# Patient Record
Sex: Female | Born: 1990 | Hispanic: Yes | Marital: Single | Smoking: Never smoker
Health system: Southern US, Community
[De-identification: ages and names within clinical notes are randomized; demographics above are authoritative.]

## PROBLEM LIST (undated history)

## (undated) DIAGNOSIS — Z789 Other specified health status: Secondary | ICD-10-CM

## (undated) HISTORY — PX: OTHER SURGICAL HISTORY: SHX169

## (undated) HISTORY — PX: TONSILLECTOMY: SUR1361

---
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
## 2019-03-20 ENCOUNTER — Inpatient Hospital Stay (HOSPITAL_COMMUNITY)
Admission: AD | Admit: 2019-03-20 | Discharge: 2019-03-21 | Disposition: A | Discharge status: Home / Self Care | Payer: Medicaid Other | Attending: Family Medicine | Admitting: Family Medicine

## 2019-03-20 ENCOUNTER — Other Ambulatory Visit: Payer: Self-pay

## 2019-03-20 ENCOUNTER — Encounter (HOSPITAL_COMMUNITY): Payer: Self-pay | Admitting: *Deleted

## 2019-03-20 DIAGNOSIS — R109 Unspecified abdominal pain: Secondary | ICD-10-CM | POA: Diagnosis present

## 2019-03-20 DIAGNOSIS — O039 Complete or unspecified spontaneous abortion without complication: Secondary | ICD-10-CM | POA: Diagnosis not present

## 2019-03-20 DIAGNOSIS — Z87891 Personal history of nicotine dependence: Secondary | ICD-10-CM | POA: Insufficient documentation

## 2019-03-20 DIAGNOSIS — O209 Hemorrhage in early pregnancy, unspecified: Secondary | ICD-10-CM | POA: Insufficient documentation

## 2019-03-20 DIAGNOSIS — Z3A13 13 weeks gestation of pregnancy: Secondary | ICD-10-CM | POA: Diagnosis not present

## 2019-03-20 DIAGNOSIS — Z3A12 12 weeks gestation of pregnancy: Secondary | ICD-10-CM | POA: Diagnosis not present

## 2019-03-20 DIAGNOSIS — O034 Incomplete spontaneous abortion without complication: Secondary | ICD-10-CM

## 2019-03-20 HISTORY — DX: Other specified health status: Z78.9

## 2019-03-20 LAB — URINALYSIS, ROUTINE W REFLEX MICROSCOPIC
Bilirubin Urine: NEGATIVE
Glucose, UA: NEGATIVE mg/dL
Ketones, ur: NEGATIVE mg/dL
Nitrite: NEGATIVE
Protein, ur: NEGATIVE mg/dL
Specific Gravity, Urine: 1.025 (ref 1.005–1.030)
pH: 6 (ref 5.0–8.0)

## 2019-03-20 LAB — POCT PREGNANCY, URINE: Preg Test, Ur: POSITIVE — AB

## 2019-03-20 NOTE — ED Notes (Signed)
Report given to cheryl and MAU and transport called for transportation [facility].

## 2019-03-20 NOTE — MAU Note (Signed)
SHE TOOK 3 HPT - MAY 10-POSITIVE. Marland Kitchen HAS AN APPOINTMENT -  WITH [facility] IN [place]. SPOTTING STARTED - BROWN - ON UNDERWEAR . FEELS CRAMPS - STARTED TODAY - TOOK REG TYLENOL1 TAB AT 11AM- NO RELIEF.  LAST SEX-  1-2 WEEKS AGO.

## 2019-03-20 NOTE — MAU Provider Note (Addendum)
History     CSN: [id number]  Arrival date and time: 03/20/19 [id number]   First Provider Initiated Contact with Patient 03/20/19 2344      Chief Complaint  Patient presents with  . Abdominal Pain   Amanda Pineda is a 28 y.o. G1P0 at 4835w6d by Definite LMP who is [facility].  She presents today for Abdominal Pain that started this morning. She describes the pain as sharp intermittent and was not relieved with 500mg  tylenol.  She currently rates the pain a 10/10 and states she is "just dealing with it."  Patient also reports back pain that started about 2 hours ago in her lower back.  Patient reports that she has been having dark brown spotting since yesterday. She reports that she has to wear a pad and reports that she is not requiring pad changes, but the pad is halfway currently.   Patient has not had an ultrasound with this pregnancy.  Patient denies sexual activity in the last 72 hours.      OB History    Gravida  1   Para      Term      Preterm      AB      Living        SAB      TAB      Ectopic      Multiple      Live Births              Past Medical History:  Diagnosis Date  . Medical history non-contributory     Past Surgical History:  Procedure Laterality Date  . OTHER SURGICAL HISTORY     cyst over eye?  . TONSILLECTOMY      History reviewed. No pertinent family history.  Social History   Tobacco Use  . Smoking status: Former Smoker  Substance Use Topics  . Alcohol use: Not Currently    Frequency: Never  . Drug use: Never    Allergies: No Known Allergies  Medications Prior to Admission  Medication Sig Dispense Refill Last Dose  . prenatal vitamin w/FE, FA (PRENATAL 1 + 1) 27-1 MG TABS tablet Take 1 tablet by mouth daily at 12 noon.   03/20/2019 at Unknown time    Review of Systems  Constitutional: Negative for chills and fever.  Respiratory: Negative for cough and shortness of breath.    Gastrointestinal: Positive for abdominal pain. Negative for constipation, diarrhea, nausea and vomiting.  Genitourinary: Positive for vaginal bleeding (Brownish). Negative for difficulty urinating, dysuria and vaginal discharge.  Musculoskeletal: Positive for back pain (Lower).  Neurological: Negative for dizziness, light-headedness and headaches.   Physical Exam   Blood pressure 111/65, pulse 79, temperature 99.5 F (37.5 C), temperature source Oral, resp. rate 20, height 5' (1.524 m), weight 59.6 kg, last menstrual period 12/20/2018, SpO2 100 %.  Physical Exam  Constitutional: She is oriented to person, place, and time. She appears well-developed and well-nourished.  HENT:  Head: Normocephalic and atraumatic.  Eyes: Conjunctivae are normal.  Neck: Normal range of motion.  Cardiovascular: Normal rate, regular rhythm and normal heart sounds.  Respiratory: Effort normal and breath sounds normal.  GI: Soft. There is no abdominal tenderness.  Genitourinary: Uterus is enlarged. Cervix exhibits motion tenderness.    Vaginal bleeding present.  There is bleeding in the vagina.    Genitourinary Comments:  Speculum Exam: -Vaginal Vault: Pink Mucosa.  Large amt dark red blood in  vault with questionable tissue.  Removed with ring forceps and gauze x 4 -wet prep collected -Cervix:Pink, no lesions or cysts.  Polyps noted posterior to os.  Os appears closed. No active bleeding from os-GC/CT collected -Bimanual Exam: Closed Uterus c/w 8-[redacted] week gestation-displaced to the right   Musculoskeletal: Normal range of motion.  Neurological: She is alert and oriented to person, place, and time.  Skin: Skin is warm and dry.  Psychiatric: She has a normal mood and affect. Her behavior is normal.    MAU Course  Procedures Results for orders placed or performed during the hospital encounter of 03/20/19 (from the past 24 hour(s))  Urinalysis, Routine w reflex microscopic     Status: Abnormal   Collection  Time: 03/20/19  8:32 PM  Result Value Ref Range   Color, Urine YELLOW YELLOW   APPearance HAZY (A) CLEAR   Specific Gravity, Urine 1.025 1.005 - 1.030   pH 6.0 5.0 - 8.0   Glucose, UA NEGATIVE NEGATIVE mg/dL   Hgb urine dipstick MODERATE (A) NEGATIVE   Bilirubin Urine NEGATIVE NEGATIVE   Ketones, ur NEGATIVE NEGATIVE mg/dL   Protein, ur NEGATIVE NEGATIVE mg/dL   Nitrite NEGATIVE NEGATIVE   Leukocytes,Ua TRACE (A) NEGATIVE   RBC / HPF 6-10 0 - 5 RBC/hpf   WBC, UA 6-10 0 - 5 WBC/hpf   Bacteria, UA RARE (A) NONE SEEN   Squamous Epithelial / LPF 6-10 0 - 5   Mucus PRESENT   Pregnancy, urine POC     Status: Abnormal   Collection Time: 03/20/19  9:01 PM  Result Value Ref Range   Preg Test, Ur POSITIVE (A) NEGATIVE  Wet prep, genital     Status: Abnormal   Collection Time: 03/21/19 12:01 AM   Specimen: Thin Prep Cervical/Endocervical  Result Value Ref Range   Yeast Wet Prep HPF POC NONE SEEN NONE SEEN   Trich, Wet Prep NONE SEEN NONE SEEN   Clue Cells Wet Prep HPF POC NONE SEEN NONE SEEN   WBC, Wet Prep HPF POC MANY (A) NONE SEEN   Sperm NONE SEEN   CBC     Status: Abnormal   Collection Time: 03/21/19 12:52 AM  Result Value Ref Range   WBC 13.2 (H) 4.0 - 10.5 K/uL   RBC 4.22 3.87 - 5.11 MIL/uL   Hemoglobin 12.3 12.0 - 15.0 g/dL   HCT 36.8 36.0 - 46.0 %   MCV 87.2 80.0 - 100.0 fL   MCH 29.1 26.0 - 34.0 pg   MCHC 33.4 30.0 - 36.0 g/dL   RDW 12.9 11.5 - 15.5 %   Platelets 283 150 - 400 K/uL   nRBC 0.0 0.0 - 0.2 %  Type and screen     Status: None   Collection Time: 03/21/19 12:52 AM  Result Value Ref Range   ABO/RH(D) O POS    Antibody Screen NEG    Sample Expiration      03/24/2019,2359 Performed at [facility], [address] [place], [place] [address]   hCG, quantitative, pregnancy     Status: Abnormal   Collection Time: 03/21/19 12:52 AM  Result Value Ref Range   hCG, Beta Chain, Quant, S 2,742 (H) <5 mIU/mL  ABO/Rh     Status: None   Collection Time:  03/21/19 12:52 AM  Result Value Ref Range   ABO/RH(D)      O POS Performed at [facility] [address], [place],  [address]  [place] Ob Less Than 14 Weeks With Ob Transvaginal  Result Date: 03/21/2019 CLINICAL DATA:  Pregnant, abdominal pain EXAM: OBSTETRIC <14 WK US AND TRANSVAGINAL OB US TECHNIQUE: Both transabdominal and transvaginal ultrasound examinations were performed for complete evaluation of the gestation as well as the maternal uterus, adnexal regions, and pelvic cul-de-sac. Transvaginal technique was performed to assess early pregnancy. COMPARISON:  None. FINDINGS: Intrauterine gestational sac: Irregular gestational sac is suspected Yolk sac:  Not visualized Embryo:  Not visualized MSD: 48.9 mm   10 w   3 d Subchorionic hemorrhage:  None visualized. Maternal uterus/adnexae: Bilateral ovaries are within normal limits. No free fluid. IMPRESSION: Suspected irregular gestational sac, measuring 10 weeks 3 days by mean sac diameter. No yolk sac or fetal pole is visualized. This appearance is not compatible with normal (viable) IUP. Findings meet definitive criteria for failed pregnancy. This follows SRU consensus guidelines: Diagnostic Criteria for Nonviable Pregnancy Early in the First Trimester. Macy Mis Engl J Med [phone];369:1443-51. Electronically Signed   By: Charline BillsSriyesh  Krishnan M.D.   On: 03/21/2019 01:49    MDM Pelvic Exam with cultures Labs: UA, CBC, hCG, T&S, Wet prep, and GC/CT TVUS Pain Medication Assessment and Plan  28 year old G1P0 12.6 weeks by Definite LMP Vaginal Bleeding  -Exam findings discussed. -Informed that excessive bleeding and inability of nurse to obtain FHR is of concern.  -Cultures collected -Percocet 5mg  ordered -Will send to US for evaluation -Labs ordered  Follow Up (2:18 AM) Failed IUP at 10.3 weeks  -Wet prep returns with insignificant findings. -Results discussed with patient. -Informed that GC/CT will return within 2-3 days. -Reviewed  all lab results.  -Discussed US findings and inevitable miscarriage. -Patient and SO appropriately upset. -Condolences given. -Allowed time to process information and provide comfort to one another.   Follow Up (2:33 AM)  -Patient and SO questions and concerns addressed including;  *Causes of miscarriage *Next steps in process  *How soon can they conceive again. -Discussed miscarriage options including expectant, pharmacological, and surgical management. -Patient and SO unable to make decision at current. -In[facility]ed [facility], but inform them of loss. -Further instructed to discuss conception time frame with provider as each provider gives different recommendation. -No other questions -Bleeding precautions given including what to expect in regards to miscarriage. -Rx for Percocet 5mg  Disp 6, RF 0 sent to pharmacy on file. -Encouraged to call or return to MAU if symptoms worsen or with the onset of new symptoms. -Discharged to home in stable condition.  Cherre RobinsJessica L Venita Seng MSN, CNM 03/20/2019, 11:44 PM

## 2019-03-21 ENCOUNTER — Inpatient Hospital Stay (HOSPITAL_COMMUNITY): Payer: Medicaid Other

## 2019-03-21 ENCOUNTER — Encounter (HOSPITAL_COMMUNITY): Payer: Self-pay

## 2019-03-21 ENCOUNTER — Inpatient Hospital Stay (EMERGENCY_DEPARTMENT_HOSPITAL)
Admission: AD | Admit: 2019-03-21 | Discharge: 2019-03-21 | Disposition: A | Discharge status: Home / Self Care | Payer: Medicaid Other | Source: Home / Self Care | Attending: Obstetrics and Gynecology | Admitting: Obstetrics and Gynecology

## 2019-03-21 DIAGNOSIS — O039 Complete or unspecified spontaneous abortion without complication: Secondary | ICD-10-CM | POA: Insufficient documentation

## 2019-03-21 DIAGNOSIS — Z3A13 13 weeks gestation of pregnancy: Secondary | ICD-10-CM

## 2019-03-21 DIAGNOSIS — O219 Vomiting of pregnancy, unspecified: Secondary | ICD-10-CM

## 2019-03-21 DIAGNOSIS — Z87891 Personal history of nicotine dependence: Secondary | ICD-10-CM | POA: Insufficient documentation

## 2019-03-21 DIAGNOSIS — N939 Abnormal uterine and vaginal bleeding, unspecified: Secondary | ICD-10-CM

## 2019-03-21 DIAGNOSIS — R102 Pelvic and perineal pain: Secondary | ICD-10-CM

## 2019-03-21 DIAGNOSIS — Z6790 Unspecified blood type, Rh positive: Secondary | ICD-10-CM

## 2019-03-21 DIAGNOSIS — O26899 Other specified pregnancy related conditions, unspecified trimester: Secondary | ICD-10-CM

## 2019-03-21 LAB — CBC
HCT: 36.8 % (ref 36.0–46.0)
HCT: 36.2 % (ref 36.0–46.0)
Hemoglobin: 12.3 g/dL (ref 12.0–15.0)
Hemoglobin: 12.3 g/dL (ref 12.0–15.0)
MCH: 29.1 pg (ref 26.0–34.0)
MCH: 29.4 pg (ref 26.0–34.0)
MCHC: 33.4 g/dL (ref 30.0–36.0)
MCHC: 34 g/dL (ref 30.0–36.0)
MCV: 87.2 fL (ref 80.0–100.0)
MCV: 86.4 fL (ref 80.0–100.0)
Platelets: 283 10*3/uL (ref 150–400)
Platelets: 267 10*3/uL (ref 150–400)
RBC: 4.22 MIL/uL (ref 3.87–5.11)
RBC: 4.19 MIL/uL (ref 3.87–5.11)
RDW: 12.9 % (ref 11.5–15.5)
RDW: 12.7 % (ref 11.5–15.5)
WBC: 13.2 10*3/uL — ABNORMAL HIGH (ref 4.0–10.5)
WBC: 18.4 10*3/uL — ABNORMAL HIGH (ref 4.0–10.5)
nRBC: 0 % (ref 0.0–0.2)
nRBC: 0 % (ref 0.0–0.2)

## 2019-03-21 LAB — WET PREP, GENITAL
Clue Cells Wet Prep HPF POC: NONE SEEN
Sperm: NONE SEEN
Trich, Wet Prep: NONE SEEN
Yeast Wet Prep HPF POC: NONE SEEN

## 2019-03-21 LAB — HCG, QUANTITATIVE, PREGNANCY: hCG, Beta Chain, Quant, S: 2742 m[IU]/mL — ABNORMAL HIGH (ref <5)

## 2019-03-21 LAB — TYPE AND SCREEN
ABO/RH(D): O POS
Antibody Screen: NEGATIVE

## 2019-03-21 LAB — ABO/RH: ABO/RH(D): O POS

## 2019-03-21 MED ORDER — KETOROLAC TROMETHAMINE 30 MG/ML IJ SOLN
30.0000 mg | Freq: Once | INTRAMUSCULAR | Status: AC
  Administered 2019-03-21: 30 mg via INTRAMUSCULAR
  Filled 2019-03-21: qty 1

## 2019-03-21 MED ORDER — PROMETHAZINE HCL 25 MG/ML IJ SOLN
12.5000 mg | Freq: Once | INTRAMUSCULAR | Status: AC
  Administered 2019-03-21: 12.5 mg via INTRAMUSCULAR
  Filled 2019-03-21: qty 1

## 2019-03-21 MED ORDER — OXYCODONE-ACETAMINOPHEN 5-325 MG PO TABS
1.0000 | ORAL_TABLET | Freq: Once | ORAL | Status: AC
  Administered 2019-03-21: 1 via ORAL
  Filled 2019-03-21: qty 1

## 2019-03-21 MED ORDER — OXYCODONE-ACETAMINOPHEN 5-325 MG PO TABS: 1.0000 | ORAL_TABLET | ORAL | 0 refills | Status: DC | PRN

## 2019-03-21 MED ORDER — PROMETHAZINE HCL 12.5 MG PO TABS: 12.5000 mg | ORAL_TABLET | Freq: Four times a day (QID) | ORAL | 0 refills | Status: DC | PRN

## 2019-03-21 MED ORDER — IBUPROFEN 600 MG PO TABS: 600.0000 mg | ORAL_TABLET | Freq: Four times a day (QID) | ORAL | 0 refills | Status: DC | PRN

## 2019-03-21 NOTE — Discharge Instructions (Signed)
Miscarriage °A miscarriage is the loss of an unborn baby (fetus) before the 20th week of pregnancy. Most miscarriages happen during the first 3 months of pregnancy. Sometimes, a miscarriage can happen before a woman knows that she is pregnant. °Having a miscarriage can be an emotional experience. If you have had a miscarriage, talk with your health care provider about any questions you may have about miscarrying, the grieving process, and your plans for future pregnancy. °What are the causes? °A miscarriage may be caused by: °· Problems with the genes or chromosomes of the fetus. These problems make it impossible for the baby to develop normally. They are often the result of random errors that occur early in the development of the baby, and are not passed from parent to child (not inherited). °· Infection of the cervix or uterus. °· Conditions that affect hormone balance in the body. °· Problems with the cervix, such as the cervix opening and thinning before pregnancy is at term (cervical insufficiency). °· Problems with the uterus. These may include: °? A uterus with an abnormal shape. °? Fibroids in the uterus. °? Congenital abnormalities. These are problems that were present at birth. °· Certain medical conditions. °· Smoking, drinking alcohol, or using drugs. °· Injury (trauma). °In many cases, the cause of a miscarriage is not known. °What are the signs or symptoms? °Symptoms of this condition include: °· Vaginal bleeding or spotting, with or without cramps or pain. °· Pain or cramping in the abdomen or lower back. °· Passing fluid, tissue, or blood clots from the vagina. °How is this diagnosed? °This condition may be diagnosed based on: °· A physical exam. °· Ultrasound. °· Blood tests. °· Urine tests. °How is this treated? °Treatment for a miscarriage is sometimes not necessary if you naturally pass all the tissue that was in your uterus. If necessary, this condition may be treated with: °· Dilation and  curettage (D&C). This is a procedure in which the cervix is stretched open and the lining of the uterus (endometrium) is scraped. This is done only if tissue from the fetus or placenta remains in the body (incomplete miscarriage). °· Medicines, such as: °? Antibiotic medicine, to treat infection. °? Medicine to help the body pass any remaining tissue. °? Medicine to reduce (contract) the size of the uterus. These medicines may be given if you have a lot of bleeding. °If you have Rh negative blood and your baby was Rh positive, you will need a shot of a medicine called Rh immunoglobulinto protect your future babies from Rh blood problems. "Rh-negative" and "Rh-positive" refer to whether or not the blood has a specific protein found on the surface of red blood cells (Rh factor). °Follow these instructions at home: °Medicines ° °· Take over-the-counter and prescription medicines only as told by your health care provider. °· If you were prescribed antibiotic medicine, take it as told by your health care provider. Do not stop taking the antibiotic even if you start to feel better. °· Do not take NSAIDs, such as aspirin and ibuprofen, unless they are approved by your health care provider. These medicines can cause bleeding. °Activity °· Rest as directed. Ask your health care provider what activities are safe for you. °· Have someone help with home and family responsibilities during this time. °General instructions °· Keep track of the number of sanitary pads you use each day and how soaked (saturated) they are. Write down this information. °· Monitor the amount of tissue or blood clots that   you pass from your vagina. Save any large amounts of tissue for your health care provider to examine. °· Do not use tampons, douche, or have sex until your health care provider approves. °· To help you and your partner with the process of grieving, talk with your health care provider or seek counseling. °· When you are ready, meet with  your health care provider to discuss any important steps you should take for your health. Also, discuss steps you should take to have a healthy pregnancy in the future. °· Keep all follow-up visits as told by your health care provider. This is important. °Where to find more information °· The American [employer] of Obstetricians [employer] Gynec[employer]: www.acog.org °· [place] Department of Health and Human Services Office of Women’s Health: www.womenshealth.gov °Contact a health care provider if: °· You have a fever or chills. °· You have a foul smelling vaginal discharge. °· You have more bleeding instead of less. °Get help right away if: °· You have severe cramps or pain in your back or abdomen. °· You pass blood clots or tissue from your vagina that is walnut-sized or larger. °· You soak more than 1 regular sanitary pad in an hour. °· You become light-headed or weak. °· You pass out. °· You have feelings of sadness that take over your thoughts, or you have thoughts of hurting yourself. °Summary °· Most miscarriages happen in the first 3 months of pregnancy. Sometimes miscarriage happens before a woman even knows that she is pregnant. °· Follow your health care provider's instruction for home care. Keep all follow-up appointments. °· To help you and your partner with the process of grieving, talk with your health care provider or seek counseling. °This information is not intended to replace advice given to you by your health care provider. Make sure you discuss any questions you have with your health care provider. °Document Released: 02/02/2001 Document Revised: 12/01/2018 Document Reviewed: 09/14/2016 °Elsevier Patient Education © 2020 [employer] ° °

## 2019-03-21 NOTE — Discharge Instructions (Signed)
Managing Pregnancy Loss °Pregnancy loss can happen any time during a pregnancy. Often the cause is not known. It is rarely because of anything you did. Pregnancy loss in early pregnancy (during the first trimester) is called a miscarriage. This type of pregnancy loss is the most common. Pregnancy loss that happens after 20 weeks of pregnancy is called fetal demise if the baby's heart stops beating before birth. Fetal demise is much less common. Some women experience spontaneous labor shortly after fetal demise resulting in a stillborn birth (stillbirth). °Any pregnancy loss can be devastating. You will need to recover both physically and emotionally. Most women are able to get pregnant again after a pregnancy loss and deliver a healthy baby. °How to manage emotional recovery ° °Pregnancy loss is very hard emotionally. You may feel many different emotions while you grieve. You may feel sad and angry. You may also feel guilty. It is normal to have periods of crying. Emotional recovery can take longer than physical recovery. It is different for everyone. °Taking these steps can help you in managing this loss: °· Remember that it is unlikely you did anything to cause the pregnancy loss. °· Share your thoughts and feelings with friends, family, and your partner. Remember that your partner is also recovering emotionally. °· Make sure you have a good support system. Do not spend too much time alone. °· Meet with a pregnancy loss counselor or join a pregnancy loss support group. °· Get enough sleep and eat a healthy diet. Return to regular exercise when you have recovered physically. °· Do not use drugs or alcohol to manage your emotions. °· Consider seeing a mental health professional to help you recover emotionally. °· Ask a friend or loved one to help you decide what to do with any clothing and nursery items you received for your baby. °In the case of a stillbirth, many women benefit from taking additional steps in the  grieving process. You may want to: °· Hold your baby after the birth. °· Name your baby. °· Request a birth certificate. °· Create a keepsake such as handprints or footprints. °· Dress your baby and have a picture taken. °· Make funeral arrangements. °· Ask for a baptism or blessing. °Hospitals have staff members who can help you with all these arrangements. °How to recognize emotional stress °It is normal to have emotional stress after a pregnancy loss. But emotional stress that lasts a long time or becomes severe requires treatment. Watch out for these signs of severe emotional stress: °· Sadness, anger, or guilt that is not going away and is interfering with your normal activities. °· Relationship problems that have occurred or gotten worse since the pregnancy loss. °· Signs of depression that last longer than 2 weeks. These may include: °? Sadness. °? Anxiety. °? Hopelessness. °? Loss of interest in activities you enjoy. °? Inability to concentrate. °? Trouble sleeping or sleeping too much. °? Loss of appetite or overeating. °? Thoughts of death or of hurting yourself. °Follow these instructions at home: °· Take over-the-counter and prescription medicines only as told by your health care provider. °· Rest at home until your energy level returns. Return to your normal activities as told by your health care provider. Ask your health care provider what activities are safe for you. °· When you are ready, meet with your health care provider to discuss steps to take for a future pregnancy. °· Keep all follow-up visits as told by your health care provider. This is important. °  important. °Where to find support °· To help you and your partner with the process of grieving, talk with your health care provider or seek counseling. °· Consider meeting with others who have experienced pregnancy loss. Ask your health care provider about support groups and resources. °Where to find more information °· [place] Department of Health and Human  Services Office on Women's Health: www.womenshealth.gov °· American Pregnancy Association: www.americanpregnancy.org °Contact a health care provider if: °· You continue to experience grief, sadness, or lack of motivation for everyday activities, and those feelings do not improve over time. °· You are struggling to recover emotionally, especially if you are using alcohol or substances to help. °Get help right away if: °· You have thoughts of hurting yourself or others. °If you ever feel like you may hurt yourself or others, or have thoughts about taking your own life, get help right away. You can go to your nearest emergency department or call: °· Your local emergency services (911 in the [place] °· A suicide crisis helpline, such as the National Suicide Prevention Lifeline at 1-800-273-8255. This is open 24 hours a day. °Summary °· Any pregnancy loss can be difficult physically and emotionally. °· You may experience many different emotions while you grieve. Emotional recovery can last longer than physical recovery. °· It is normal to have emotional stress after a pregnancy loss. But emotional stress that lasts a long time or becomes severe requires treatment. °· See your health care provider if you are struggling emotionally after a pregnancy loss. °This information is not intended to replace advice given to you by your health care provider. Make sure you discuss any questions you have with your health care provider. °Document Released: 10/20/2017 Document Revised: 11/29/2018 Document Reviewed: 10/20/2017 °Elsevier Patient Education © 2020 [employer] °Miscarriage °A miscarriage is the loss of an unborn baby (fetus) before the 20th week of pregnancy. Most miscarriages happen during the first 3 months of pregnancy. Sometimes, a miscarriage can happen before a woman knows that she is pregnant. °Having a miscarriage can be an emotional experience. If you have had a miscarriage, talk with your health care provider  about any questions you may have about miscarrying, the grieving process, and your plans for future pregnancy. °What are the causes? °A miscarriage may be caused by: °· Problems with the genes or chromosomes of the fetus. These problems make it impossible for the baby to develop normally. They are often the result of random errors that occur early in the development of the baby, and are not passed from parent to child (not inherited). °· Infection of the cervix or uterus. °· Conditions that affect hormone balance in the body. °· Problems with the cervix, such as the cervix opening and thinning before pregnancy is at term (cervical insufficiency). °· Problems with the uterus. These may include: °? A uterus with an abnormal shape. °? Fibroids in the uterus. °? Congenital abnormalities. These are problems that were present at birth. °· Certain medical conditions. °· Smoking, drinking alcohol, or using drugs. °· Injury (trauma). °In many cases, the cause of a miscarriage is not known. °What are the signs or symptoms? °Symptoms of this condition include: °· Vaginal bleeding or spotting, with or without cramps or pain. °· Pain or cramping in the abdomen or lower back. °· Passing fluid, tissue, or blood clots from the vagina. °How is this diagnosed? °This condition may be diagnosed based on: °· A physical exam. °· Ultrasound. °· Blood tests. °· Urine tests. °How is   this treated? °Treatment for a miscarriage is sometimes not necessary if you naturally pass all the tissue that was in your uterus. If necessary, this condition may be treated with: °· Dilation and curettage (D&C). This is a procedure in which the cervix is stretched open and the lining of the uterus (endometrium) is scraped. This is done only if tissue from the fetus or placenta remains in the body (incomplete miscarriage). °· Medicines, such as: °? Antibiotic medicine, to treat infection. °? Medicine to help the body pass any remaining tissue. °? Medicine to  reduce (contract) the size of the uterus. These medicines may be given if you have a lot of bleeding. °If you have Rh negative blood and your baby was Rh positive, you will need a shot of a medicine called Rh immunoglobulinto protect your future babies from Rh blood problems. "Rh-negative" and "Rh-positive" refer to whether or not the blood has a specific protein found on the surface of red blood cells (Rh factor). °Follow these instructions at home: °Medicines ° °· Take over-the-counter and prescription medicines only as told by your health care provider. °· If you were prescribed antibiotic medicine, take it as told by your health care provider. Do not stop taking the antibiotic even if you start to feel better. °· Do not take NSAIDs, such as aspirin and ibuprofen, unless they are approved by your health care provider. These medicines can cause bleeding. °Activity °· Rest as directed. Ask your health care provider what activities are safe for you. °· Have someone help with home and family responsibilities during this time. °General instructions °· Keep track of the number of sanitary pads you use each day and how soaked (saturated) they are. Write down this information. °· Monitor the amount of tissue or blood clots that you pass from your vagina. Save any large amounts of tissue for your health care provider to examine. °· Do not use tampons, douche, or have sex until your health care provider approves. °· To help you and your partner with the process of grieving, talk with your health care provider or seek counseling. °· When you are ready, meet with your health care provider to discuss any important steps you should take for your health. Also, discuss steps you should take to have a healthy pregnancy in the future. °· Keep all follow-up visits as told by your health care provider. This is important. °Where to find more information °· The American Congress of Obstetricians and Gynecologists: www.acog.org °· [place]  Department of Health and Human Services Office of Women’s Health: www.womenshealth.gov °Contact a health care provider if: °· You have a fever or chills. °· You have a foul smelling vaginal discharge. °· You have more bleeding instead of less. °Get help right away if: °· You have severe cramps or pain in your back or abdomen. °· You pass blood clots or tissue from your vagina that is walnut-sized or larger. °· You soak more than 1 regular sanitary pad in an hour. °· You become light-headed or weak. °· You pass out. °· You have feelings of sadness that take over your thoughts, or you have thoughts of hurting yourself. °Summary °· Most miscarriages happen in the first 3 months of pregnancy. Sometimes miscarriage happens before a woman even knows that she is pregnant. °· Follow your health care provider's instruction for home care. Keep all follow-up appointments. °· To help you and your partner with the process of grieving, talk with your health care provider or seek counseling. °  This information is not intended to replace advice given to you by your health care provider. Make sure you discuss any questions you have with your health care provider. °Document Released: 02/02/2001 Document Revised: 12/01/2018 Document Reviewed: 09/14/2016 °Elsevier Patient Education © 2020 Elsevier Inc. ° °

## 2019-03-21 NOTE — MAU Note (Signed)
Pt reports extreme abdominal pain and vaginal bleeding. Is having active miscarriage. Left MAU early this morning. Took first Percocet at 1000 this morning, but vomited right after that . Has used 2 pads since leaving MAU

## 2019-03-21 NOTE — MAU Provider Note (Signed)
History     CSN: [id number]  Arrival date and time: 03/21/19 1039   First Provider Initiated Contact with Patient 03/21/19 1148      Chief Complaint  Patient presents with   Vaginal Bleeding   Abdominal Pain   Ms. Amanda Pineda is a 28 y.o. G1P0 at 8639w0d who presents to MAU for vaginal bleeding and pain. Of note, pt was discharged from MAU this morning around 0230/0300 and diagnosed with failed pregnancy. Pt's boyfriend present for entire visit.  Onset: yesterday, with worsening pain today Location: suprapubic/LBP Duration: ~24hrs Character: "like somebody's stabbing me," intermittent every few minutes Aggravating/Associated: none/N/Vx2, VB - used two pads since leaving MAU Relieving: none Treatment: attempted to take Percocet around 1000, vomited 5minutes later and did not see pill come up, was given Percocet in MAU around 0016 today successfully Severity: 10/10 at peak  Pt denies vaginal discharge/odor/itching. Pt denies abdominal pain, constipation, diarrhea, or urinary problems. Pt denies fever, chills, fatigue, sweating or changes in appetite. Pt denies SOB or chest pain. Pt denies dizziness, HA, light-headedness, weakness.  Problems this pregnancy include: failed pregnancy. Allergies? NKDA Current medications/supplements? PNVs   OB History    Gravida  1   Para      Term      Preterm      AB      Living        SAB      TAB      Ectopic      Multiple      Live Births              Past Medical History:  Diagnosis Date   Medical history non-contributory     Past Surgical History:  Procedure Laterality Date   OTHER SURGICAL HISTORY     cyst over eye?   TONSILLECTOMY      History reviewed. No pertinent family history.  Social History   Tobacco Use   Smoking status: Former Smoker  Substance Use Topics   Alcohol use: Not Currently    Frequency: Never   Drug use: Never    Allergies: No Known Allergies  Medications  Prior to Admission  Medication Sig Dispense Refill Last Dose   oxyCODONE-acetaminophen (PERCOCET/ROXICET) 5-325 MG tablet Take 1-2 tablets by mouth every 4 (four) hours as needed for severe pain. 6 tablet 0 03/21/2019 at Unknown time   prenatal vitamin w/FE, FA (PRENATAL 1 + 1) 27-1 MG TABS tablet Take 1 tablet by mouth daily at 12 noon.       Review of Systems  Constitutional: Negative for chills, diaphoresis, fatigue and fever.  Respiratory: Negative for shortness of breath.   Cardiovascular: Negative for chest pain.  Gastrointestinal: Positive for nausea and vomiting. Negative for abdominal pain, constipation and diarrhea.  Genitourinary: Positive for pelvic pain and vaginal bleeding. Negative for dysuria, flank pain, frequency, urgency and vaginal discharge.  Neurological: Negative for dizziness, weakness, light-headedness and headaches.   Physical Exam   Blood pressure 126/77, pulse 75, temperature 98 F (36.7 C), temperature source Oral, resp. rate 18, last menstrual period 12/20/2018, SpO2 100 %.  Patient Vitals for the past 24 hrs:  BP Temp Temp src Pulse Resp SpO2  03/21/19 1149 126/77 -- -- 75 -- --  03/21/19 1123 129/79 -- -- 67 -- --  03/21/19 1122 129/79 98 F (36.7 C) Oral 67 18 100 %   Physical Exam  Constitutional: She is oriented to person, place, and time. She appears well-developed and well-nourished.  No distress.  HENT:  Head: Normocephalic and atraumatic.  Respiratory: Effort normal.  GI: Soft. She exhibits no distension and no mass. There is no abdominal tenderness. There is no rebound and no guarding.  Genitourinary: There is no rash, tenderness or lesion on the right labia. There is no rash, tenderness or lesion on the left labia. Uterus is not enlarged and not tender. Cervix exhibits no motion tenderness, no discharge and no friability.    Vaginal bleeding present.     No vaginal discharge or tenderness.  There is bleeding in the vagina. No tenderness in  the vagina.       Genitourinary Comments: Large gestational sac appearing mass removed from vagina, gently, with ring forceps and sent to pathology. Blood able to be cleared away with easily with few Fox swabs. Minimal active bleeding noted from external os on exam, external os dilated.   Neurological: She is alert and oriented to person, place, and time.  Skin: Skin is warm and dry. She is not diaphoretic.  Psychiatric: She has a normal mood and affect. Her behavior is normal. Judgment and thought content normal.   Results for orders placed or performed during the hospital encounter of 03/21/19 (from the past 24 hour(s))  CBC     Status: Abnormal   Collection Time: 03/21/19 12:56 PM  Result Value Ref Range   WBC 18.4 (H) 4.0 - 10.5 K/uL   RBC 4.19 3.87 - 5.11 MIL/uL   Hemoglobin 12.3 12.0 - 15.0 g/dL   HCT 36.2 36.0 - 46.0 %   MCV 86.4 80.0 - 100.0 fL   MCH 29.4 26.0 - 34.0 pg   MCHC 34.0 30.0 - 36.0 g/dL   RDW 12.7 11.5 - 15.5 %   Platelets 267 150 - 400 K/uL   nRBC 0.0 0.0 - 0.2 %   US Ob Transvaginal  Result Date: 03/21/2019 CLINICAL DATA:  Increased vaginal bleeding EXAM: TRANSVAGINAL OB ULTRASOUND TECHNIQUE: Transvaginal ultrasound was performed for complete evaluation of the gestation as well as the maternal uterus, adnexal regions, and pelvic cul-de-sac. COMPARISON:  Obstetrical ultrasound March 11, 2019 performed earlier in the day FINDINGS: Intrauterine gestational sac: No longer apparent. Yolk sac:  Not visualized Embryo:  Not visualized Cardiac Activity: Not visualized Subchorionic hemorrhage:  None visualized. Maternal uterus/adnexae: Endometrium mildly inhomogeneous, likely with mild hemorrhage within the endometrium. Cervical os currently closed. No extrauterine pelvic or adnexal mass currently evident. Right ovary measures 3.1 x 1.1 x 2.8 cm. Left ovary measures 2.2 x 1.2 x 1.4 cm. No free pelvic fluid. IMPRESSION: Gestational sac no longer apparent consistent with  spontaneous abortion. Mild hemorrhage within the endometrium. Study otherwise unremarkable. Electronically Signed   By: Lowella Grip III M.D.   On: 03/21/2019 14:57   US Ob Less Than 14 Weeks With Ob Transvaginal  Result Date: 03/21/2019 CLINICAL DATA:  Pregnant, abdominal pain EXAM: OBSTETRIC <14 WK [place] AND TRANSVAGINAL OB US TECHNIQUE: Both transabdominal and transvaginal ultrasound examinations were performed for complete evaluation of the gestation as well as the maternal uterus, adnexal regions, and pelvic cul-de-sac. Transvaginal technique was performed to assess early pregnancy. COMPARISON:  None. FINDINGS: Intrauterine gestational sac: Irregular gestational sac is suspected Yolk sac:  Not visualized Embryo:  Not visualized MSD: 48.9 mm   10 w   3 d Subchorionic hemorrhage:  None visualized. Maternal uterus/adnexae: Bilateral ovaries are within normal limits. No free fluid. IMPRESSION: Suspected irregular gestational sac, measuring 10 weeks 3 days by mean sac diameter. No yolk  sac or fetal pole is visualized. This appearance is not compatible with normal (viable) IUP. Findings meet definitive criteria for failed pregnancy. This follows SRU consensus guidelines: Diagnostic Criteria for Nonviable Pregnancy Early in the First Trimester. Macy Mis Engl J Med [id number]. Electronically Signed   By: Charline BillsSriyesh  Krishnan M.D.   On: 03/21/2019 01:49   MAU Course  Procedures  MDM -failed pregnancy with pain and bleeding -ABO: O Positive -exam deferred until after onset of medications -Toradol 30mg  + Phenergan 12.5mg  given IM -after administration of above medications, pt reports N/V has resolved and pain has greatly improved -CBC: WBCs 18.4, otherwise WNL -spoke with Dr. Alysia PennaErvin @1403 , no need to treat based on elevated WBCs as pt is afebrile -likely gestational sac removed during pelvic exam, sent to pathology for review -US: GS no longer present, c/w SAB -pt reports pain is now 0/10 prior to  discharge -pt discharged to home in stable condition  Orders Placed This Encounter  Procedures   US OB Transvaginal    Pt diagnosed early this AM with failed pregnancy. States she "felt something pop" in her stomach.    Standing Status:   Standing    Number of Occurrences:   1    Order Specific Question:   Symptom/Reason for Exam    Answer:   Abdominal pain in pregnancy [[id number]][[id number]]   CBC    Standing Status:   Standing    Number of Occurrences:   1   Discharge patient    Order Specific Question:   Discharge disposition    Answer:   01-Home or Self Care [1]    Order Specific Question:   Discharge patient date    Answer:   03/21/2019   Meds ordered this encounter  Medications   promethazine (PHENERGAN) injection 12.5 mg   ketorolac (TORADOL) 30 MG/ML injection 30 mg   promethazine (PHENERGAN) 12.5 MG tablet    Sig: Take 1 tablet (12.5 mg total) by mouth every 6 (six) hours as needed for nausea or vomiting.    Dispense:  30 tablet    Refill:  0    Order Specific Question:   Supervising Provider    Answer:   Alysia PennaERVIN, MICHAEL L [[id number]]   ibuprofen (ADVIL) 600 MG tablet    Sig: Take 1 tablet (600 mg total) by mouth every 6 (six) hours as needed for up to 30 doses for moderate pain or cramping.    Dispense:  30 tablet    Refill:  0    Order Specific Question:   Supervising Provider    Answer:   Alysia PennaERVIN, MICHAEL L [[id number]]   Assessment and Plan   1. Miscarriage   2. Abdominal pain in pregnancy   3. Blood type, Rh positive   4. Vaginal bleeding   5. Pelvic pain   6. Nausea and vomiting during pregnancy    Allergies as of 03/21/2019   No Known Allergies     Medication List    TAKE these medications   ibuprofen 600 MG tablet Commonly known as: ADVIL Take 1 tablet (600 mg total) by mouth every 6 (six) hours as needed for up to 30 doses for moderate pain or cramping.   oxyCODONE-acetaminophen 5-325 MG tablet Commonly known as: PERCOCET/ROXICET Take 1-2 tablets by mouth every 4  (four) hours as needed for severe pain.   prenatal vitamin w/FE, FA 27-1 MG Tabs tablet Take 1 tablet by mouth daily at 12 noon.   promethazine 12.5 MG tablet Commonly known as: PHENERGAN  Take 1 tablet (12.5 mg total) by mouth every 6 (six) hours as needed for nausea or vomiting.      -take Percocet as prescribed previously -Ibuprofen 600 mg 1 tablet by mouth every 6 hours as needed - prescribed -Phenergan 12.5 mg by mouth every 6 hours as needed for nausea - prescribed -[facility] message sent to [facility] to call pt to schedule with provider in 2 weeks -pain/bleeding/infection/return MAU precautions given -pt discharged to home in stable condition  Joni Reiningicole E Lamario Mani 03/21/2019, 3:15 PM

## 2019-03-22 LAB — GC/CHLAMYDIA PROBE AMP (~~LOC~~) NOT AT ARMC
Chlamydia: NEGATIVE
Neisseria Gonorrhea: NEGATIVE

## 2019-03-28 ENCOUNTER — Other Ambulatory Visit: Payer: Self-pay

## 2019-03-28 ENCOUNTER — Ambulatory Visit: Payer: Medicaid Other | Admitting: *Deleted

## 2019-03-28 DIAGNOSIS — O039 Complete or unspecified spontaneous abortion without complication: Secondary | ICD-10-CM

## 2019-03-29 ENCOUNTER — Telehealth (INDEPENDENT_AMBULATORY_CARE_PROVIDER_SITE_OTHER): Payer: Medicaid Other | Admitting: Family Medicine

## 2019-03-29 DIAGNOSIS — O039 Complete or unspecified spontaneous abortion without complication: Secondary | ICD-10-CM

## 2019-03-29 LAB — BETA HCG QUANT (REF LAB): hCG Quant: 109 m[IU]/mL

## 2019-03-29 NOTE — Telephone Encounter (Signed)
Spoke with patient about her appointments. She had some questions, and is requesting a call back from the nurse.

## 2019-04-02 NOTE — Telephone Encounter (Signed)
Attempted to call pt in regards to questions she has. Pt did not answer. Voicemail not set up so unable to leave a message for the patient at this time.

## 2019-04-03 ENCOUNTER — Telehealth: Payer: Self-pay | Admitting: Family Medicine

## 2019-04-03 NOTE — Telephone Encounter (Signed)
Called and spoke with patient in regards to her earlier phone call.   Pt wanted the results of her lab from yesterday and wanted to know why she needs follow up. Discussed they will follow her until her Hcg is at 0 to make sure the pregnancy is completely resolved. Pt voiced understanding. Pt to follow up with Lab tomorrow and CNM on 8/18.

## 2019-04-03 NOTE — Telephone Encounter (Signed)
Spoke with patient about her appointment on 8/12 @ 1:30. Patient instructed to wear a face mask for the entire appointment and no visitors are allowed during the visit. Patient screened for covid symptoms and denied having any.

## 2019-04-04 ENCOUNTER — Other Ambulatory Visit: Payer: Self-pay

## 2019-04-04 ENCOUNTER — Other Ambulatory Visit: Payer: Medicaid Other

## 2019-04-04 DIAGNOSIS — O039 Complete or unspecified spontaneous abortion without complication: Secondary | ICD-10-CM

## 2019-04-05 ENCOUNTER — Other Ambulatory Visit: Payer: Medicaid Other

## 2019-04-05 LAB — BETA HCG QUANT (REF LAB): hCG Quant: 22 m[IU]/mL

## 2019-04-10 ENCOUNTER — Other Ambulatory Visit: Payer: Self-pay

## 2019-04-10 ENCOUNTER — Encounter: Payer: Self-pay | Admitting: Family Medicine

## 2019-04-10 ENCOUNTER — Telehealth: Payer: Medicaid Other

## 2019-04-10 DIAGNOSIS — Z91199 Patient's noncompliance with other medical treatment and regimen due to unspecified reason: Secondary | ICD-10-CM

## 2019-04-10 DIAGNOSIS — Z5329 Procedure and treatment not carried out because of patient's decision for other reasons: Secondary | ICD-10-CM

## 2019-04-10 NOTE — Progress Notes (Signed)
@  1003am VM not set up yet cannot leave a message. @1010am  message stating the person you're trying to reach doesn't have a voicemail box set up

## 2019-04-10 NOTE — Progress Notes (Signed)
Unable to reach patient for virtual visit and no VM set up.  Amanda Pineda, CNM 04/10/19 10:51 AM

## 2019-05-20 ENCOUNTER — Encounter (HOSPITAL_COMMUNITY): Payer: Self-pay | Admitting: Emergency Medicine

## 2019-05-20 ENCOUNTER — Emergency Department (HOSPITAL_COMMUNITY)
Admission: EM | Admit: 2019-05-20 | Discharge: 2019-05-21 | Discharge status: Against Medical Advice | Payer: Medicaid Other | Attending: Emergency Medicine | Admitting: Emergency Medicine

## 2019-05-20 DIAGNOSIS — Z5321 Procedure and treatment not carried out due to patient leaving prior to being seen by health care provider: Secondary | ICD-10-CM | POA: Diagnosis not present

## 2019-05-20 DIAGNOSIS — R1031 Right lower quadrant pain: Secondary | ICD-10-CM | POA: Diagnosis present

## 2019-05-20 LAB — URINALYSIS, ROUTINE W REFLEX MICROSCOPIC
Bilirubin Urine: NEGATIVE
Glucose, UA: NEGATIVE mg/dL
Hgb urine dipstick: NEGATIVE
Ketones, ur: 20 mg/dL — AB
Leukocytes,Ua: NEGATIVE
Nitrite: NEGATIVE
Protein, ur: NEGATIVE mg/dL
Specific Gravity, Urine: 1.029 (ref 1.005–1.030)
pH: 5 (ref 5.0–8.0)

## 2019-05-20 LAB — CBC WITH DIFFERENTIAL/PLATELET
Abs Immature Granulocytes: 0.03 10*3/uL (ref 0.00–0.07)
Basophils Absolute: 0 10*3/uL (ref 0.0–0.1)
Basophils Relative: 0 %
Eosinophils Absolute: 0 10*3/uL (ref 0.0–0.5)
Eosinophils Relative: 0 %
HCT: 40.3 % (ref 36.0–46.0)
Hemoglobin: 13 g/dL (ref 12.0–15.0)
Immature Granulocytes: 0 %
Lymphocytes Relative: 34 %
Lymphs Abs: 2.6 10*3/uL (ref 0.7–4.0)
MCH: 28.8 pg (ref 26.0–34.0)
MCHC: 32.3 g/dL (ref 30.0–36.0)
MCV: 89.4 fL (ref 80.0–100.0)
Monocytes Absolute: 0.5 10*3/uL (ref 0.1–1.0)
Monocytes Relative: 6 %
Neutro Abs: 4.6 10*3/uL (ref 1.7–7.7)
Neutrophils Relative %: 60 %
Platelets: 284 10*3/uL (ref 150–400)
RBC: 4.51 MIL/uL (ref 3.87–5.11)
RDW: 12.4 % (ref 11.5–15.5)
WBC: 7.8 10*3/uL (ref 4.0–10.5)
nRBC: 0 % (ref 0.0–0.2)

## 2019-05-20 LAB — COMPREHENSIVE METABOLIC PANEL
ALT: 11 U/L (ref 0–44)
AST: 20 U/L (ref 15–41)
Albumin: 4.2 g/dL (ref 3.5–5.0)
Alkaline Phosphatase: 53 U/L (ref 38–126)
Anion gap: 9 (ref 5–15)
BUN: 13 mg/dL (ref 6–20)
CO2: 25 mmol/L (ref 22–32)
Calcium: 9.3 mg/dL (ref 8.9–10.3)
Chloride: 102 mmol/L (ref 98–111)
Creatinine, Ser: 0.8 mg/dL (ref 0.44–1.00)
GFR calc Af Amer: >60 mL/min (ref >60)
GFR calc non Af Amer: >60 mL/min (ref >60)
Glucose, Bld: 86 mg/dL (ref 70–99)
Potassium: 4.5 mmol/L (ref 3.5–5.1)
Sodium: 136 mmol/L (ref 135–145)
Total Bilirubin: 0.5 mg/dL (ref 0.3–1.2)
Total Protein: 6.7 g/dL (ref 6.5–8.1)

## 2019-05-20 LAB — I-STAT BETA HCG BLOOD, ED (MC, WL, AP ONLY): I-stat hCG, quantitative: <5 m[IU]/mL (ref <5)

## 2019-05-20 LAB — LIPASE, BLOOD: Lipase: 30 U/L (ref 11–51)

## 2019-05-20 NOTE — ED Triage Notes (Signed)
Pt here with c/o right flank / lower right qaurdart pain , ongoing for 1 week , no n/v , no abnormal bleeding , burning or discharge noted

## 2019-05-21 NOTE — ED Notes (Signed)
Pt called x 2 with no answer  

## 2020-03-03 IMAGING — US TRANSVAGINAL OB ULTRASOUND
1 series · 15 of 28 positions shown · non-contrast
Comparison: Obstetrical ultrasound March 11, 2019 performed earlier
in the day

CLINICAL DATA: Increased vaginal bleeding

EXAM:
TRANSVAGINAL OB ULTRASOUND
TECHNIQUE: Transvaginal ultrasound was performed for complete evaluation of the
gestation as well as the maternal uterus, adnexal regions, and
pelvic cul-de-sac.

[Series 1: transvaginal ob ultrasound · 15 of 52 slices shown]
[im 1/52]
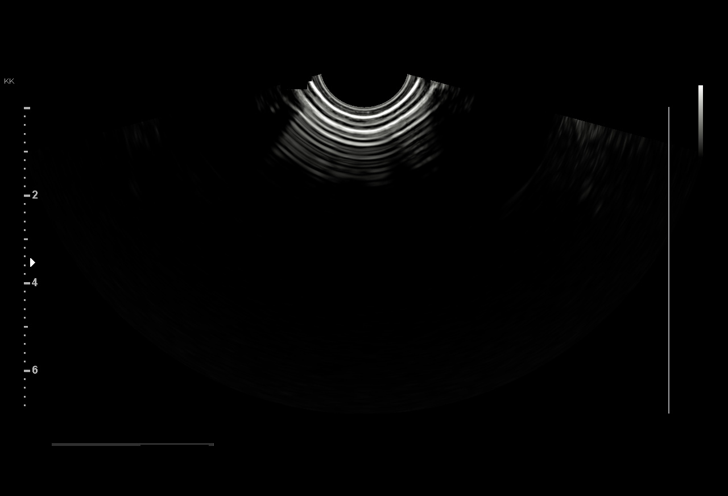
[im 4/52]
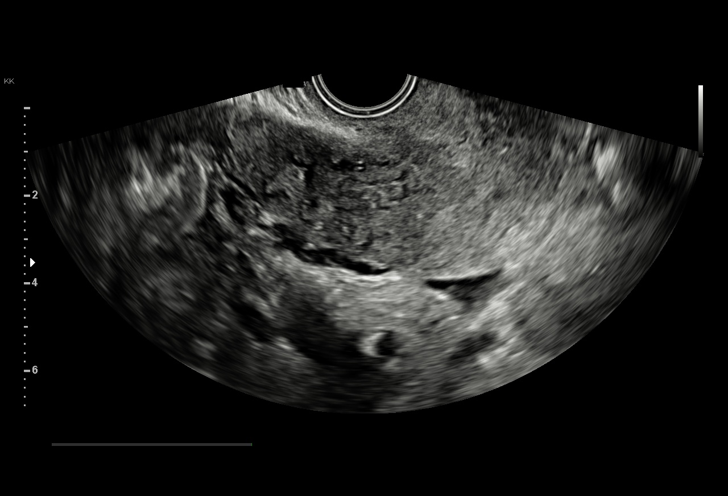
[im 8/52]
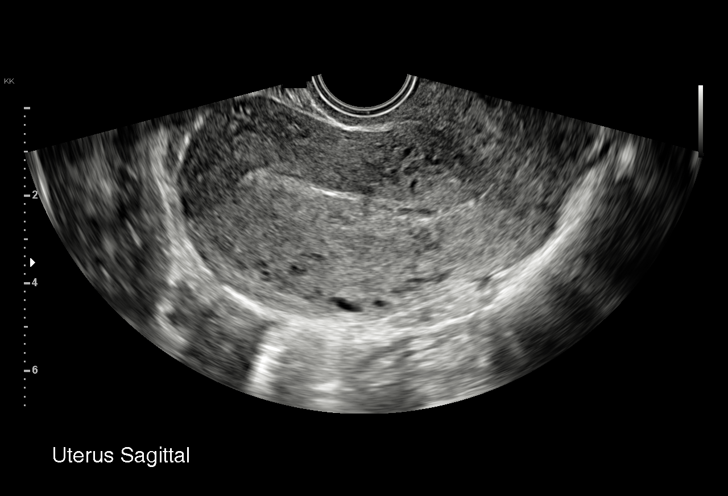
[im 12/52]
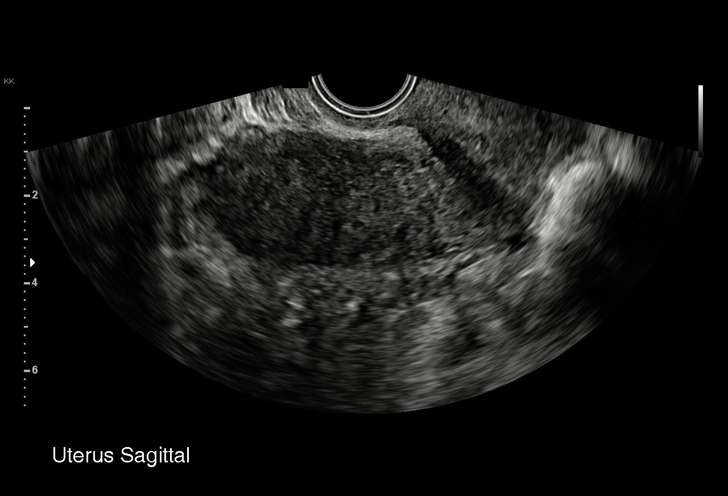
[im 16/52]
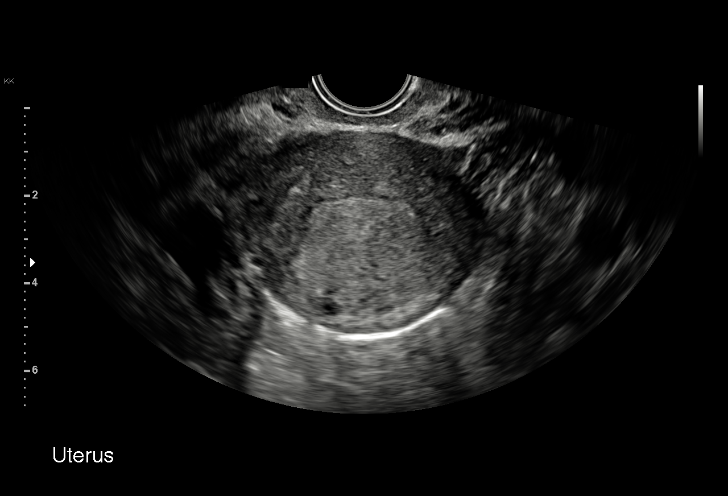
[im 19/52]
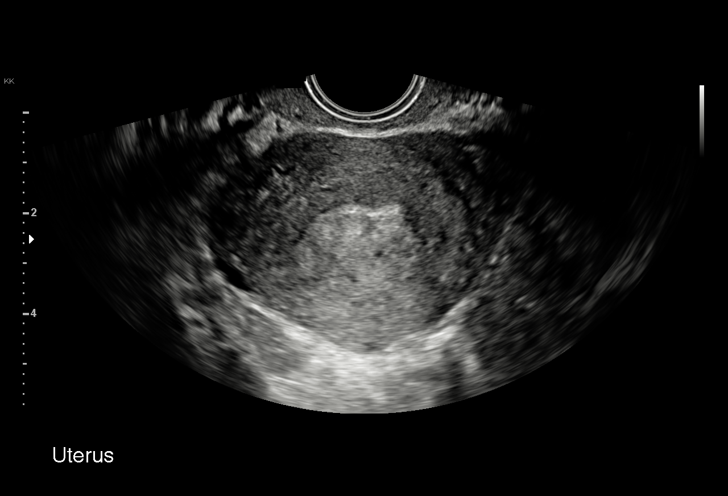
[im 23/52]
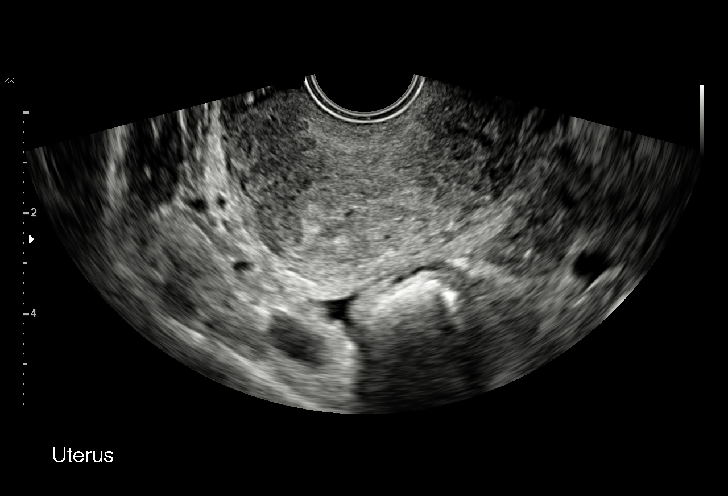
[im 27/52]
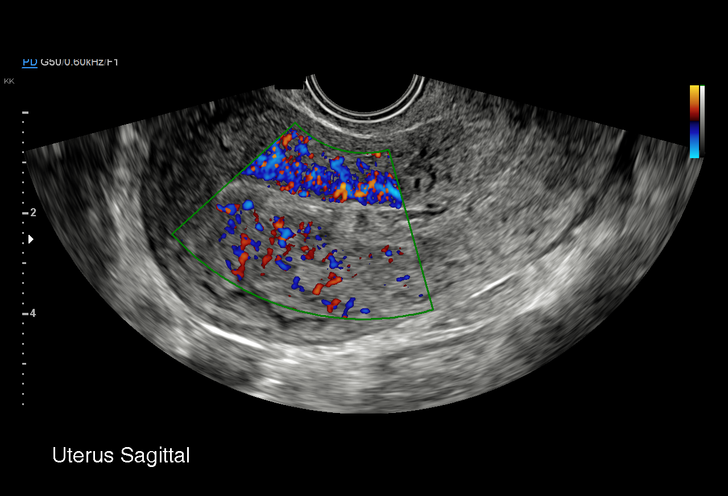
[im 29/52]
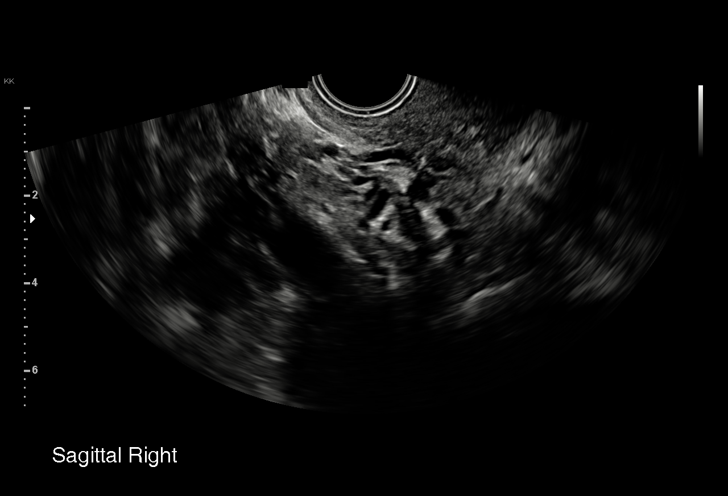
[im 33/52]
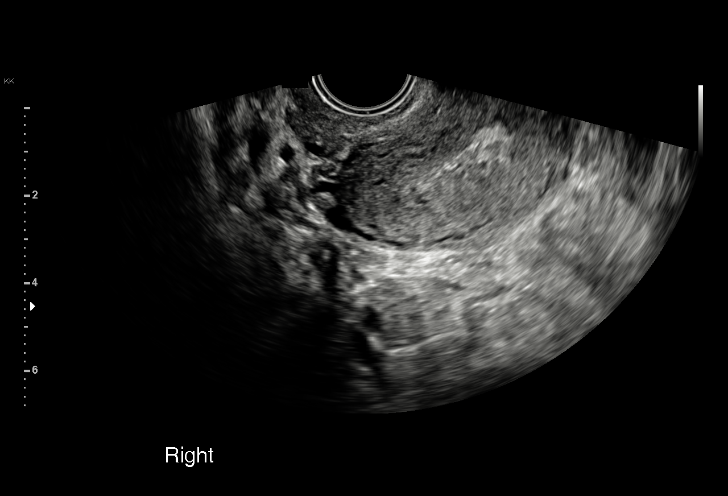
[im 36/52]
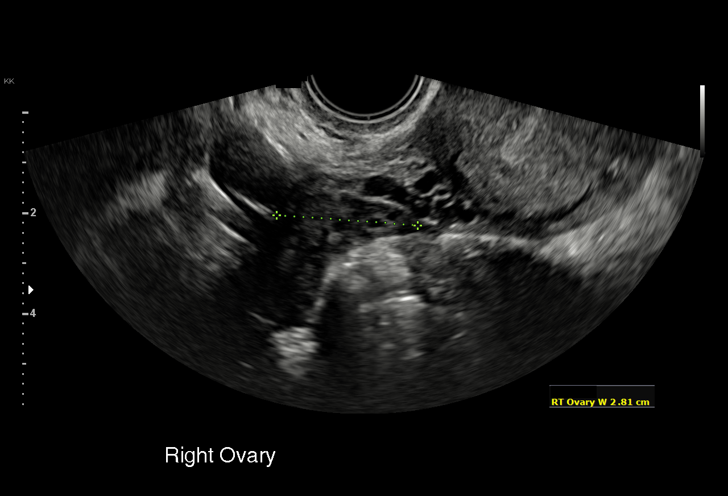
[im 40/52]
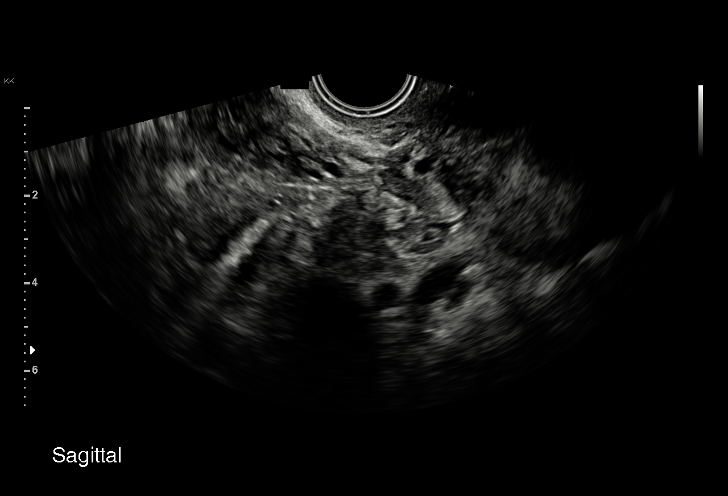
[im 44/52]
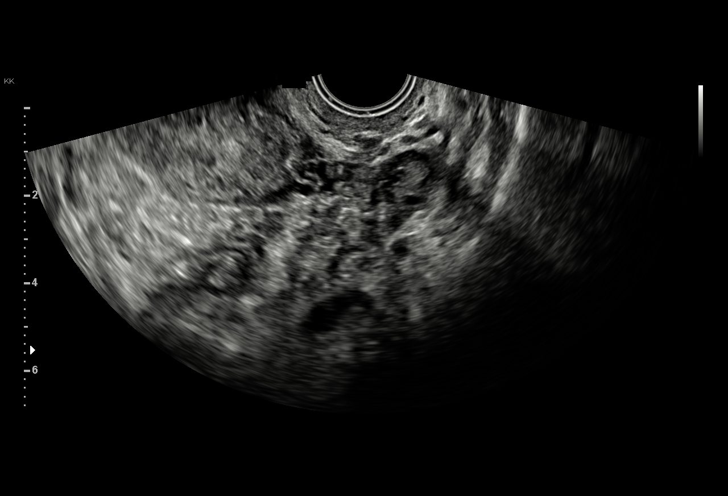
[im 48/52]
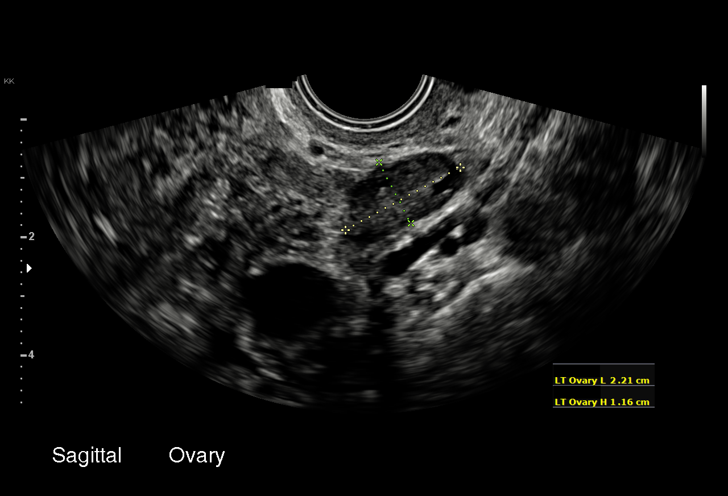
[im 52/52]
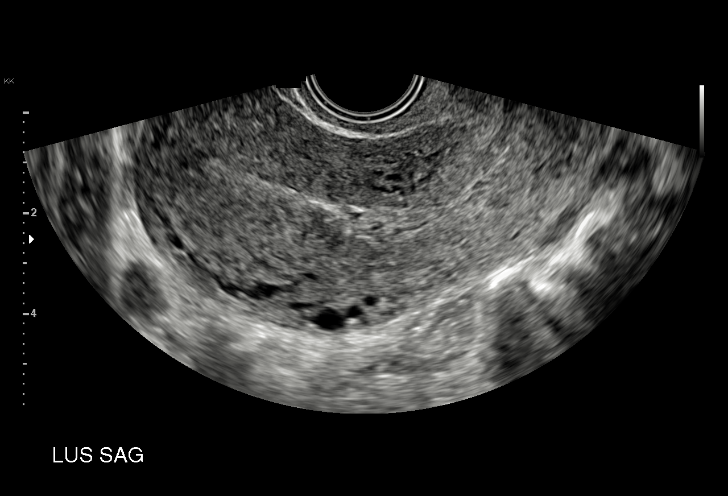

[15 of 28 positions shown; findings below may reference images not displayed]

FINDINGS: Intrauterine gestational sac: No longer apparent.

Yolk sac:  Not visualized

Embryo:  Not visualized

Cardiac Activity: Not visualized

Subchorionic hemorrhage:  None visualized.

Maternal uterus/adnexae: Endometrium mildly inhomogeneous, likely
with mild hemorrhage within the endometrium. Cervical os currently
closed. No extrauterine pelvic or adnexal mass currently evident.
Right ovary measures 3.1 x 1.1 x 2.8 cm. Left ovary measures 2.2 x
1.2 x 1.4 cm. No free pelvic fluid.
IMPRESSION: Gestational sac no longer apparent consistent with spontaneous
abortion. Mild hemorrhage within the endometrium. Study otherwise
unremarkable.

## 2020-03-03 IMAGING — US OBSTETRIC <14 WK US AND TRANSVAGINAL OB US
1 series · 15 of 28 positions shown · non-contrast
Comparison: None.

CLINICAL DATA: Pregnant, abdominal pain

EXAM:
OBSTETRIC <14 WK US AND TRANSVAGINAL OB US
TECHNIQUE: Both transabdominal and transvaginal ultrasound examinations were
performed for complete evaluation of the gestation as well as the
maternal uterus, adnexal regions, and pelvic cul-de-sac.
Transvaginal technique was performed to assess early pregnancy.

[Series 1: obstetric <14 wk us and transvaginal ob us · 15 of 56 slices shown]
[im 1/56]
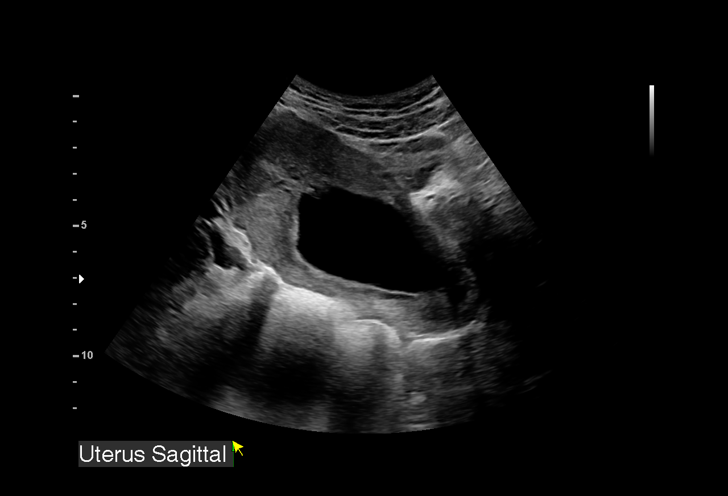
[im 5/56]
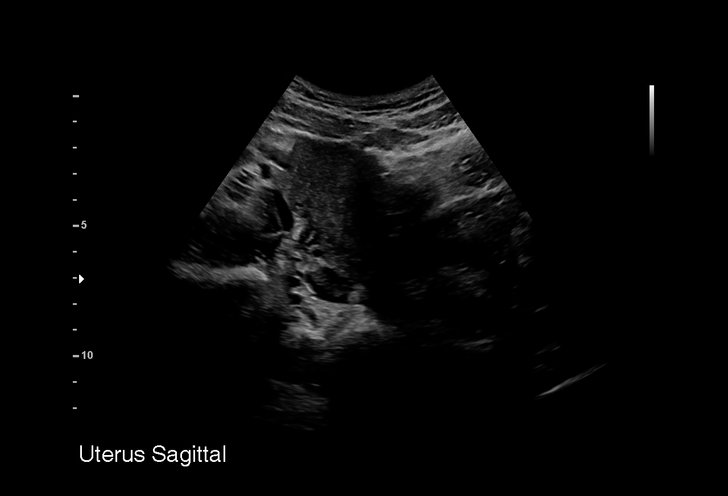
[im 9/56]
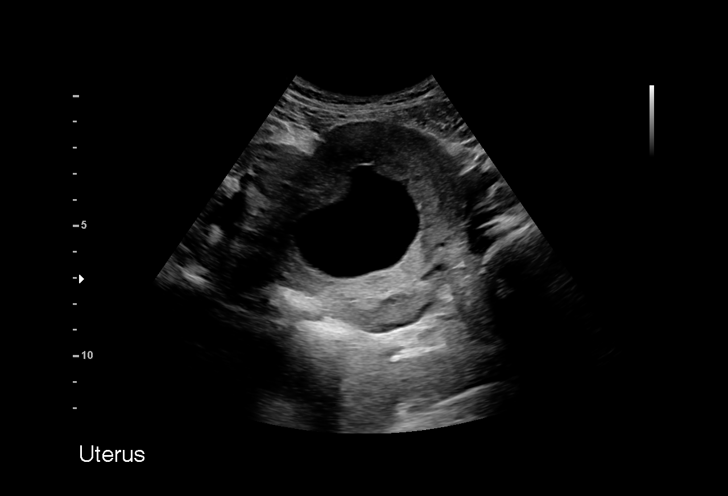
[im 13/56]
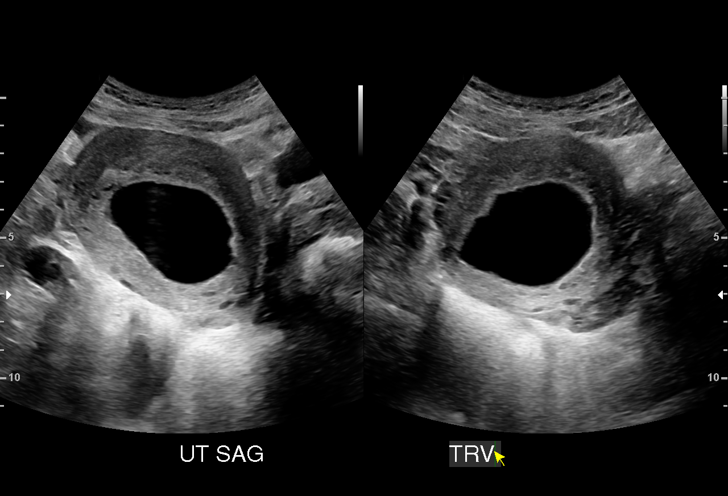
[im 17/56]
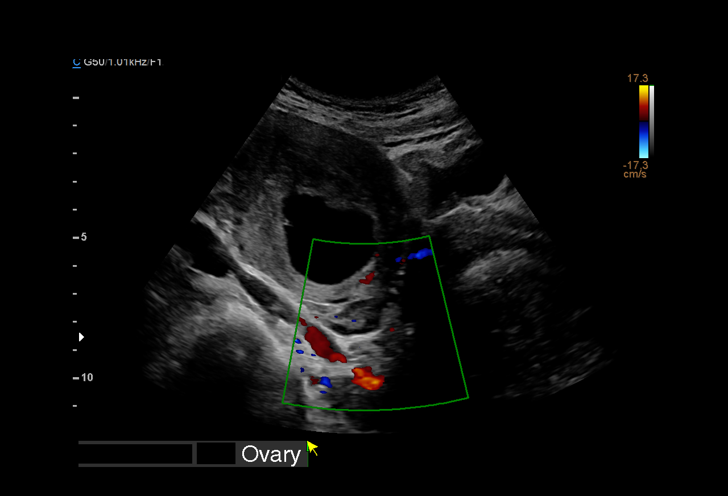
[im 21/56]
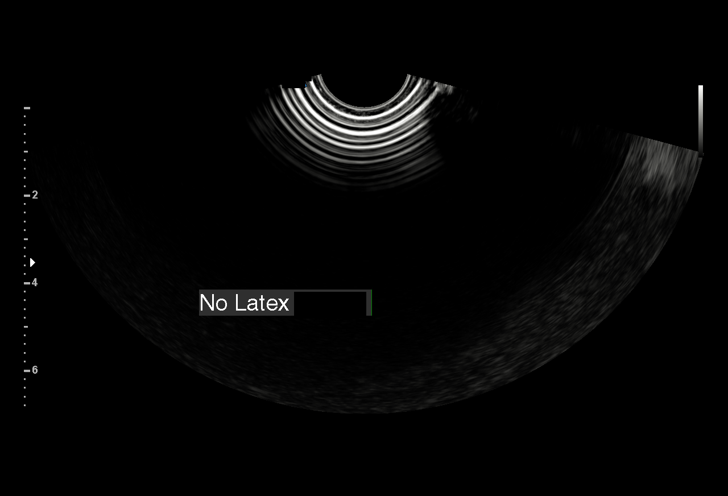
[im 25/56]
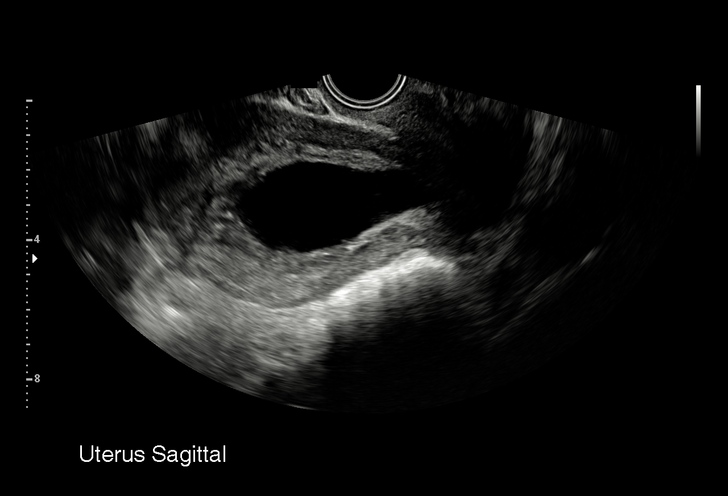
[im 29/56]
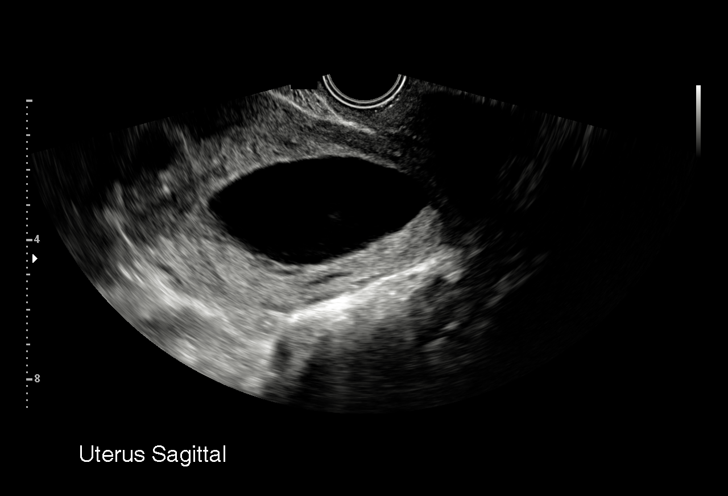
[im 31/56]
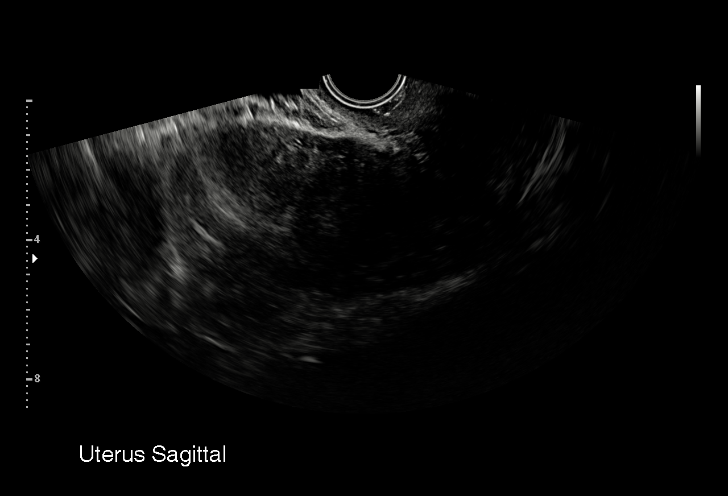
[im 35/56]
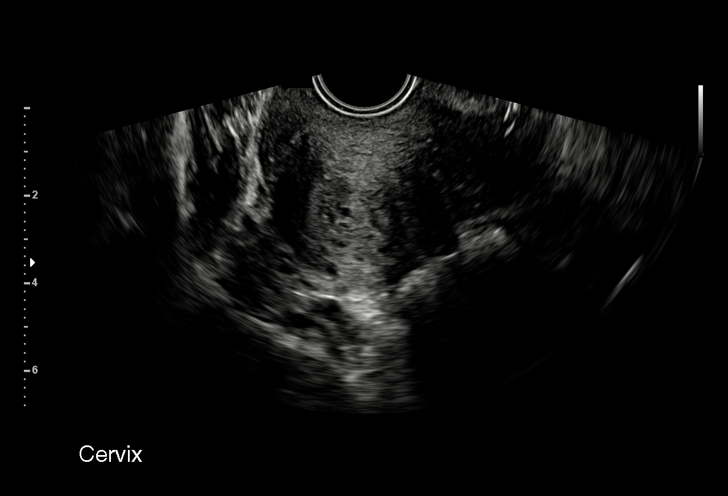
[im 39/56]
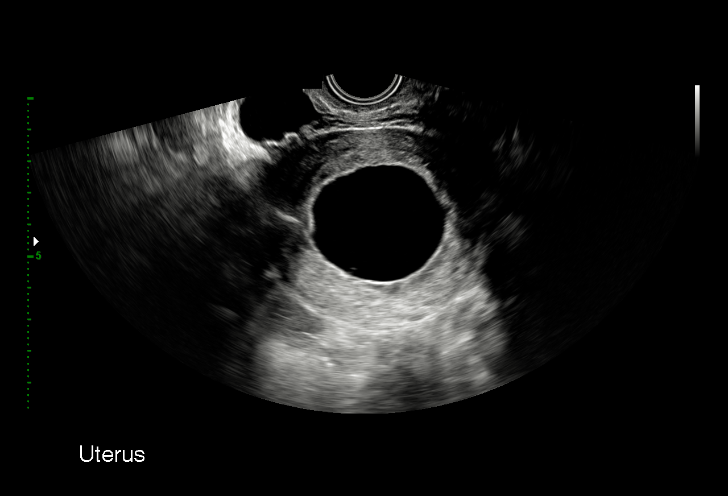
[im 43/56]
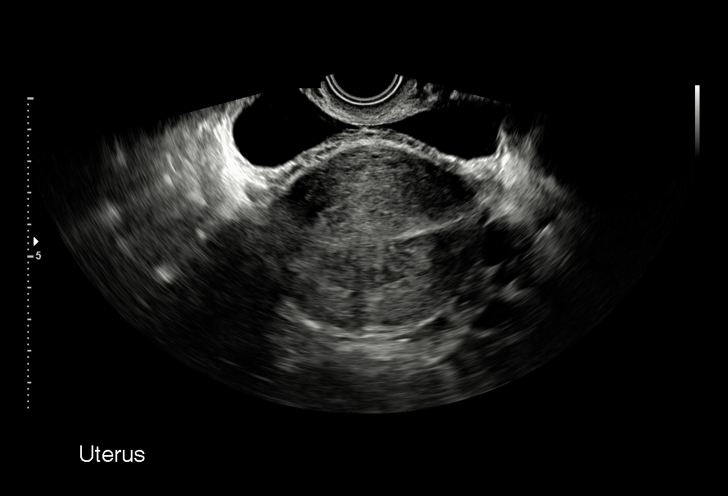
[im 47/56]
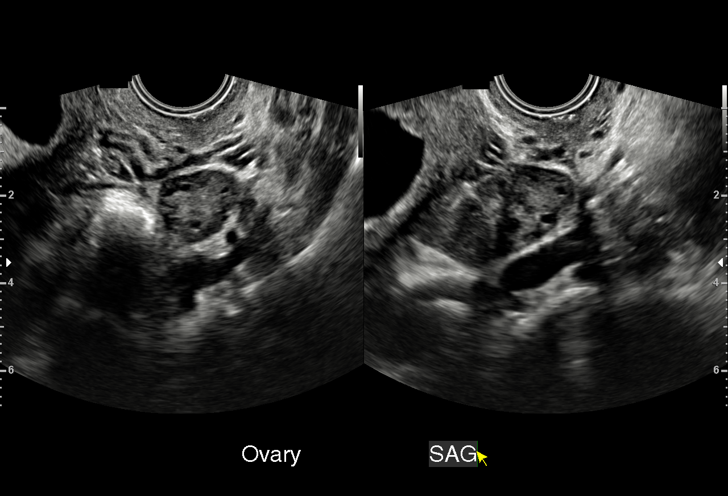
[im 51/56]
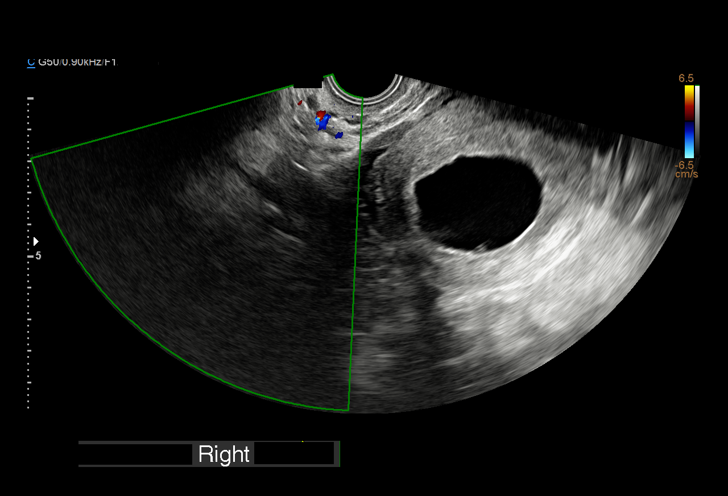
[im 56/56]
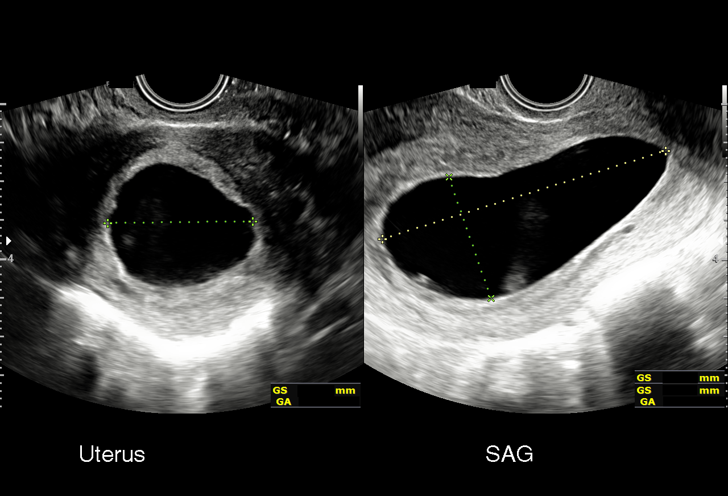

[15 of 28 positions shown; findings below may reference images not displayed]

FINDINGS: Intrauterine gestational sac: Irregular gestational sac is suspected

Yolk sac:  Not visualized

Embryo:  Not visualized

MSD: 48.9 mm   10 w   3 d

Subchorionic hemorrhage:  None visualized.

Maternal uterus/adnexae: Bilateral ovaries are within normal limits.

No free fluid.
IMPRESSION: Suspected irregular gestational sac, measuring 10 weeks 3 days by
mean sac diameter. No yolk sac or fetal pole is visualized. This
appearance is not compatible with normal (viable) IUP.

Findings meet definitive criteria for failed pregnancy. This follows
SRU consensus guidelines: Diagnostic Criteria for Nonviable
Pregnancy Early in the First Trimester. N Engl J Med

## 2020-04-03 DIAGNOSIS — Z20822 Contact with and (suspected) exposure to covid-19: Secondary | ICD-10-CM | POA: Diagnosis not present

## 2020-06-27 DIAGNOSIS — Z3689 Encounter for other specified antenatal screening: Secondary | ICD-10-CM | POA: Diagnosis not present

## 2020-06-27 DIAGNOSIS — Z3200 Encounter for pregnancy test, result unknown: Secondary | ICD-10-CM | POA: Diagnosis not present

## 2020-07-11 DIAGNOSIS — Z3201 Encounter for pregnancy test, result positive: Secondary | ICD-10-CM | POA: Diagnosis not present

## 2020-07-25 DIAGNOSIS — Z3689 Encounter for other specified antenatal screening: Secondary | ICD-10-CM | POA: Diagnosis not present

## 2020-07-25 DIAGNOSIS — O09291 Supervision of pregnancy with other poor reproductive or obstetric history, first trimester: Secondary | ICD-10-CM | POA: Diagnosis not present

## 2020-07-25 DIAGNOSIS — Z3A10 10 weeks gestation of pregnancy: Secondary | ICD-10-CM | POA: Diagnosis not present

## 2020-07-25 DIAGNOSIS — O09299 Supervision of pregnancy with other poor reproductive or obstetric history, unspecified trimester: Secondary | ICD-10-CM | POA: Diagnosis not present

## 2020-07-25 LAB — OB RESULTS CONSOLE RUBELLA ANTIBODY, IGM: Rubella: IMMUNE

## 2020-07-25 LAB — OB RESULTS CONSOLE GC/CHLAMYDIA
Chlamydia: NEGATIVE
Gonorrhea: NEGATIVE

## 2020-07-25 LAB — OB RESULTS CONSOLE HIV ANTIBODY (ROUTINE TESTING): HIV: NONREACTIVE

## 2020-07-25 LAB — OB RESULTS CONSOLE HEPATITIS B SURFACE ANTIGEN: Hepatitis B Surface Ag: NEGATIVE

## 2020-07-25 LAB — OB RESULTS CONSOLE RPR: RPR: NONREACTIVE

## 2020-08-19 DIAGNOSIS — Z3401 Encounter for supervision of normal first pregnancy, first trimester: Secondary | ICD-10-CM | POA: Diagnosis not present

## 2020-09-25 LAB — OB RESULTS CONSOLE ABO/RH: RH Type: POSITIVE

## 2021-02-16 ENCOUNTER — Inpatient Hospital Stay (HOSPITAL_COMMUNITY): Payer: BC Managed Care – PPO | Admitting: Anesthesiology

## 2021-02-16 ENCOUNTER — Encounter (HOSPITAL_COMMUNITY): Payer: Self-pay | Admitting: Obstetrics and Gynecology

## 2021-02-16 ENCOUNTER — Other Ambulatory Visit: Payer: Self-pay

## 2021-02-16 ENCOUNTER — Inpatient Hospital Stay (HOSPITAL_COMMUNITY)
Admission: AD | Admit: 2021-02-16 | Discharge: 2021-02-19 | DRG: 786 | Disposition: A | Discharge status: Home / Self Care | Payer: BC Managed Care – PPO | Attending: Obstetrics & Gynecology | Admitting: Obstetrics & Gynecology

## 2021-02-16 DIAGNOSIS — O411230 Chorioamnionitis, third trimester, not applicable or unspecified: Secondary | ICD-10-CM | POA: Diagnosis present

## 2021-02-16 DIAGNOSIS — O0933 Supervision of pregnancy with insufficient antenatal care, third trimester: Secondary | ICD-10-CM

## 2021-02-16 DIAGNOSIS — Z8616 Personal history of COVID-19: Secondary | ICD-10-CM | POA: Diagnosis present

## 2021-02-16 DIAGNOSIS — O411290 Chorioamnionitis, unspecified trimester, not applicable or unspecified: Secondary | ICD-10-CM | POA: Diagnosis not present

## 2021-02-16 DIAGNOSIS — Z3A39 39 weeks gestation of pregnancy: Secondary | ICD-10-CM | POA: Diagnosis not present

## 2021-02-16 DIAGNOSIS — O99892 Other specified diseases and conditions complicating childbirth: Secondary | ICD-10-CM | POA: Diagnosis present

## 2021-02-16 DIAGNOSIS — O329XX0 Maternal care for malpresentation of fetus, unspecified, not applicable or unspecified: Secondary | ICD-10-CM | POA: Diagnosis not present

## 2021-02-16 DIAGNOSIS — O9902 Anemia complicating childbirth: Secondary | ICD-10-CM | POA: Diagnosis present

## 2021-02-16 DIAGNOSIS — Z3A00 Weeks of gestation of pregnancy not specified: Secondary | ICD-10-CM

## 2021-02-16 DIAGNOSIS — O26893 Other specified pregnancy related conditions, third trimester: Secondary | ICD-10-CM | POA: Diagnosis present

## 2021-02-16 LAB — RAPID URINE DRUG SCREEN, HOSP PERFORMED
Amphetamines: NOT DETECTED
Barbiturates: NOT DETECTED
Benzodiazepines: NOT DETECTED
Cocaine: NOT DETECTED
Opiates: NOT DETECTED
Tetrahydrocannabinol: NOT DETECTED

## 2021-02-16 LAB — COMPREHENSIVE METABOLIC PANEL
ALT: 12 U/L (ref 0–44)
AST: 33 U/L (ref 15–41)
Albumin: 3.4 g/dL — ABNORMAL LOW (ref 3.5–5.0)
Alkaline Phosphatase: 121 U/L (ref 38–126)
Anion gap: 13 (ref 5–15)
BUN: <5 mg/dL — ABNORMAL LOW (ref 6–20)
CO2: 20 mmol/L — ABNORMAL LOW (ref 22–32)
Calcium: 9.6 mg/dL (ref 8.9–10.3)
Chloride: 106 mmol/L (ref 98–111)
Creatinine, Ser: 0.81 mg/dL (ref 0.44–1.00)
GFR, Estimated: >60 mL/min (ref >60)
Glucose, Bld: 79 mg/dL (ref 70–99)
Potassium: 4 mmol/L (ref 3.5–5.1)
Sodium: 139 mmol/L (ref 135–145)
Total Bilirubin: 0.7 mg/dL (ref 0.3–1.2)
Total Protein: 7.1 g/dL (ref 6.5–8.1)

## 2021-02-16 LAB — CBC
HCT: 37.9 % (ref 36.0–46.0)
Hemoglobin: 12.9 g/dL (ref 12.0–15.0)
MCH: 29.3 pg (ref 26.0–34.0)
MCHC: 34 g/dL (ref 30.0–36.0)
MCV: 85.9 fL (ref 80.0–100.0)
Platelets: 231 10*3/uL (ref 150–400)
RBC: 4.41 MIL/uL (ref 3.87–5.11)
RDW: 13 % (ref 11.5–15.5)
WBC: 14.1 10*3/uL — ABNORMAL HIGH (ref 4.0–10.5)
nRBC: 0 % (ref 0.0–0.2)

## 2021-02-16 LAB — TYPE AND SCREEN
ABO/RH(D): O POS
Antibody Screen: NEGATIVE

## 2021-02-16 LAB — RPR: RPR Ser Ql: NONREACTIVE

## 2021-02-16 LAB — GROUP B STREP BY PCR: Group B strep by PCR: NEGATIVE

## 2021-02-16 MED ORDER — PHENYLEPHRINE 40 MCG/ML (10ML) SYRINGE FOR IV PUSH (FOR BLOOD PRESSURE SUPPORT): 80.0000 ug | PREFILLED_SYRINGE | INTRAVENOUS | Status: DC | PRN

## 2021-02-16 MED ORDER — FENTANYL-BUPIVACAINE-NACL 0.5-0.125-0.9 MG/250ML-% EP SOLN: 12.0000 mL/h | EPIDURAL | Status: DC | PRN

## 2021-02-16 MED ORDER — OXYTOCIN-SODIUM CHLORIDE 30-0.9 UT/500ML-% IV SOLN
1.0000 m[IU]/min | INTRAVENOUS | Status: DC
  Administered 2021-02-16: 2 m[IU]/min via INTRAVENOUS

## 2021-02-16 MED ORDER — OXYTOCIN-SODIUM CHLORIDE 30-0.9 UT/500ML-% IV SOLN
2.5000 [IU]/h | INTRAVENOUS | Status: DC
  Filled 2021-02-16: qty 500

## 2021-02-16 MED ORDER — EPHEDRINE 5 MG/ML INJ: 10.0000 mg | INTRAVENOUS | Status: DC | PRN

## 2021-02-16 MED ORDER — DIPHENHYDRAMINE HCL 50 MG/ML IJ SOLN: 12.5000 mg | INTRAMUSCULAR | Status: DC | PRN

## 2021-02-16 MED ORDER — LACTATED RINGERS IV SOLN: 500.0000 mL | Freq: Once | INTRAVENOUS | Status: DC

## 2021-02-16 MED ORDER — OXYTOCIN BOLUS FROM INFUSION: 333.0000 mL | Freq: Once | INTRAVENOUS | Status: DC

## 2021-02-16 MED ORDER — SOD CITRATE-CITRIC ACID 500-334 MG/5ML PO SOLN
30.0000 mL | ORAL | Status: DC | PRN
  Administered 2021-02-17: 30 mL via ORAL
  Filled 2021-02-16: qty 30

## 2021-02-16 MED ORDER — TERBUTALINE SULFATE 1 MG/ML IJ SOLN: 0.2500 mg | Freq: Once | INTRAMUSCULAR | Status: DC | PRN

## 2021-02-16 MED ORDER — OXYCODONE-ACETAMINOPHEN 5-325 MG PO TABS: 2.0000 | ORAL_TABLET | ORAL | Status: DC | PRN

## 2021-02-16 MED ORDER — ONDANSETRON HCL 4 MG/2ML IJ SOLN: 4.0000 mg | Freq: Four times a day (QID) | INTRAMUSCULAR | Status: DC | PRN

## 2021-02-16 MED ORDER — LIDOCAINE HCL (PF) 1 % IJ SOLN
INTRAMUSCULAR | Status: DC | PRN
  Administered 2021-02-16 (×2): 3 mL via EPIDURAL

## 2021-02-16 MED ORDER — LACTATED RINGERS IV SOLN: 500.0000 mL | INTRAVENOUS | Status: DC | PRN

## 2021-02-16 MED ORDER — FLEET ENEMA 7-19 GM/118ML RE ENEM: 1.0000 | ENEMA | RECTAL | Status: DC | PRN

## 2021-02-16 MED ORDER — OXYCODONE-ACETAMINOPHEN 5-325 MG PO TABS: 1.0000 | ORAL_TABLET | ORAL | Status: DC | PRN

## 2021-02-16 MED ORDER — ACETAMINOPHEN 325 MG PO TABS: 650.0000 mg | ORAL_TABLET | ORAL | Status: DC | PRN

## 2021-02-16 MED ORDER — LACTATED RINGERS IV SOLN: INTRAVENOUS | Status: DC

## 2021-02-16 MED ORDER — LIDOCAINE HCL (PF) 1 % IJ SOLN: 30.0000 mL | INTRAMUSCULAR | Status: DC | PRN

## 2021-02-16 MED ORDER — FENTANYL-BUPIVACAINE-NACL 0.5-0.125-0.9 MG/250ML-% EP SOLN
EPIDURAL | Status: AC
  Filled 2021-02-16: qty 250

## 2021-02-16 NOTE — Anesthesia Preprocedure Evaluation (Signed)
Anesthesia Evaluation  Patient identified by MRN, date of birth, ID band Patient awake    Reviewed: Allergy & Precautions, Patient's Chart, lab work & pertinent test results  Airway Mallampati: II  TM Distance: >3 FB Neck ROM: Full    Dental no notable dental hx. (+) Dental Advisory Given, Teeth Intact   Pulmonary neg pulmonary ROS,    Pulmonary exam normal breath sounds clear to auscultation       Cardiovascular negative cardio ROS Normal cardiovascular exam Rhythm:Regular Rate:Normal     Neuro/Psych negative neurological ROS     GI/Hepatic negative GI ROS, Neg liver ROS,   Endo/Other  negative endocrine ROS  Renal/GU negative Renal ROS     Musculoskeletal negative musculoskeletal ROS (+)   Abdominal   Peds  Hematology negative hematology ROS (+)   Anesthesia Other Findings   Reproductive/Obstetrics (+) Pregnancy                             Anesthesia Physical Anesthesia Plan  ASA: 2  Anesthesia Plan: Epidural   Post-op Pain Management:    Induction:   PONV Risk Score and Plan:   Airway Management Planned:   Additional Equipment:   Intra-op Plan:   Post-operative Plan:   Informed Consent: I have reviewed the patients History and Physical, chart, labs and discussed the procedure including the risks, benefits and alternatives for the proposed anesthesia with the patient or authorized representative who has indicated his/her understanding and acceptance.       Plan Discussed with:   Anesthesia Plan Comments:         Anesthesia Quick Evaluation

## 2021-02-16 NOTE — Anesthesia Procedure Notes (Addendum)
Epidural Patient location during procedure: OB Start time: 02/16/2021 3:27 PM End time: 02/16/2021 3:45 PM  Staffing Anesthesiologist: Lewie Loron, MD Performed: anesthesiologist   Preanesthetic Checklist Completed: patient identified, IV checked, risks and benefits discussed, monitors and equipment checked, pre-op evaluation and timeout performed  Epidural Patient position: sitting Prep: DuraPrep and site prepped and draped Patient monitoring: heart rate, continuous pulse ox and blood pressure Approach: midline Location: L2-L3 Injection technique: LOR air and LOR saline  Needle:  Needle type: Tuohy  Needle gauge: 17 G Needle length: 9 cm Needle insertion depth: 6 cm Catheter type: closed end flexible Catheter size: 19 Gauge Catheter at skin depth: 11 cm Test dose: negative  Assessment Sensory level: T8 Events: blood not aspirated, injection not painful, no injection resistance, no paresthesia and negative IV test  Additional Notes Reason for block:procedure for pain

## 2021-02-16 NOTE — Progress Notes (Signed)
                                                                                                                                                                                                                                                                                                                       Amanda Pineda is a 30 y.o. G2P0010 at [redacted]w[redacted]d by ultrasound admitted for latent labor.   Subjective: Comfortable with epidural. No complaints. Denies pressure or pain.   Objective: Pitocin at 4 milliunits  Vitals:   02/16/21 2000 02/16/21 2010 02/16/21 2030 02/16/21 2105  BP: 109/70  108/63   Pulse: 94  (!) 111   Resp:  18    Temp:    98.3 F (36.8 C)  TempSrc:    Oral  SpO2:      Weight:      Height:        I/O last 3 completed shifts: In: -  Out: 75 [Urine:75] No intake/output data recorded.   FHT:  FHR: 135 bpm, variability: moderate,  accelerations:  Absent,  decelerations:  Present recurrent late and variable decelerations UC:   regular, every 2- 4.5 minutes SVE:   Dilation: 7 Effacement (%):  Station: -1 Exam by:: m Makayli Bracken, CNM Edematous cervix now 6-7cm, asynclitic/ROP R/B of IUPC reviewed with patient/agrees and placed without difficulty  Labs:   Recent Labs    02/16/21 1358  WBC 14.1*  HGB 12.9  HCT 37.9  PLT 231    Assessment / Plan: G2P0010 30 y.o. [redacted]w[redacted]d Protracted active phase due to fetal decelerations and inadequate contractions.   Labor:  Plan to increase Pitocin for inadequate contractions based on fetal tolerance   Fetal Wellbeing:  Category II Pain Control:  Epidural  Anticipated MOD:  guarded, working towards NSVD  Dr. [name] updated with plan of care and patient status and has reviewed the  strip remotely. Will plan for close monitoring.   Karena Addison, CNM, MSN 02/16/2021, 11:08 PM

## 2021-02-16 NOTE — H&P (Addendum)
OB ADMISSION/ HISTORY & PHYSICAL:  Admission Date: 02/16/2021 11:34 AM  Admit Diagnosis: Normal labor [O80, Z37.9]    Amanda Pineda is a 30 y.o. female G2P0 at [redacted]w[redacted]d presenting for labor contractions since 5:30am. Denies VB, LOF. Support person Molli Hazard at bedside and supportive. Expecting baby boy "[name]."  Prenatal History: G2P0010   EDC : 02/19/2021, Date entered prior to episode creation  [facility] care at [facility] from 10 weeks -19 weeks, then no prenatal care   Prenatal course complicated by: - Insufficient prenatal care, she received care from 10-19 weeks, but no care since 19 weeks due to financial reasons per patient - Hx. Of SAB x 1 in 2020 - Covid 19 infection x 2 (January 2022, June 2022)   Prenatal Labs: ABO, Rh: O Positive (02/03 0000)  Antibody: NEG (06/27 1352) Rubella:   Immune RPR:   NR at NOB visit, pending HBsAg:   Negative HIV:   NR GBS: NEGATIVE/-- (06/27 1358)  1 hr Glucola : unknown Genetic Screening: AFP-1 normal, declined all other genetic testing Ultrasound: normal female anatomy, anterior placenta, EFW 53%    Maternal Diabetes: unknown; HgbA1c pending Genetic Screening: AFP-1 normal, declined all genetic testing Maternal Ultrasounds/Referrals: Normal Fetal Ultrasounds or other Referrals:  None Maternal Substance Abuse:  No Denies alcohol and drug use during pregnancy Significant Maternal Medications:  None Significant Maternal Lab Results:  Group B Strep negative Other Comments:  None  Medical / Surgical History :  Past medical history:  Past Medical History:  Diagnosis Date   Medical history non-contributory      Past surgical history:  Past Surgical History:  Procedure Laterality Date   OTHER SURGICAL HISTORY     cyst over eye?   TONSILLECTOMY       Family History: History reviewed. No pertinent family history.   Social History:  reports that she has never smoked. She has never used smokeless tobacco. She reports previous  alcohol use. She reports that she does not use drugs.   Allergies: Patient has no known allergies.   Current Medications at time of admission:  Medications Prior to Admission  Medication Sig Dispense Refill Last Dose   prenatal vitamin w/FE, FA (PRENATAL 1 + 1) 27-1 MG TABS tablet Take 1 tablet by mouth daily at 12 noon.   02/15/2021 at 1000   Tylenol PRN                       Review of Systems: Review of Systems  Cardiovascular:  Negative for leg swelling.  Genitourinary:        Contractions   All other systems reviewed and are negative.  Physical Exam: Vital signs and nursing notes reviewed.  ED Triage Vitals  Enc Vitals Group     BP 02/16/21 1157 107/75     Pulse Rate 02/16/21 1157 88     Resp 02/16/21 1157 20     Temp 02/16/21 1157 98.5 F (36.9 C)     Temp Source 02/16/21 1157 Oral     SpO2 02/16/21 1157 99 %     Weight 02/16/21 1152 155 lb (70.3 kg)     Height 02/16/21 1152 5' (1.524 m)     Head Circumference --      Peak Flow --      Pain Score 02/16/21 1154 8     Pain Loc --      Pain Edu? --      Excl. in GC? --  General: AAO x 3, NAD, pleasant, comfortable with epidural  Heart: RRR Lungs:CTAB Abdomen: Gravid, NT, Leopold's 7#8-8# Extremities: no edema Genitalia / VE: Dilation: 4.5 Effacement (%): 100 Station: -2 Presentation: Vertex Exam by:: k fields, rn  Reassessment by me: 4.5/100/-3 BBOW/ - station too high for AROM  FHR: 135-140 BPM, moderate variability, +10x10 accels, occasional variable decels TOCO: Ctx q 1-6 minutes  Labs:   Pending T&S, CBC, RPR  Recent Labs    02/16/21 1358  WBC 14.1*  HGB 12.9  HCT 37.9  PLT 231       Assessment:  30 y.o. G2P0010 at [redacted]w[redacted]d  1. Latent stage of labor, now protracted labor contractions since epidural  2. FHR category 2 3. GBS Negative 4. Breastfeeding 5. Placenta disposal hospital disposal   Plan:  1. Admit to BS 2. Routine L&D orders - Rapid GBS collected in MAU and negative -  Add HgbA1C, UDS, and CMP to admission labs - CSW consult postpartum  3. Epidural in place  4. Begin Pitocin augmentation 2 milliunits x 2 milliunits and reassess for AROM in 2 hrs  5. Anticipate NSVB   Dr. [name] notified of admission / plan of care   Karena Addison CNM, MSN 02/16/2021, 3:58 PM

## 2021-02-16 NOTE — Progress Notes (Addendum)
                                                                                                                                                                                                                                                                                                                       Amanda Pineda is a 30 y.o. G2P0010 at [redacted]w[redacted]d by ultrasound admitted for early  labor.  Her contractions spaced after epidural placement, and Pitocin was initiated at 1707. Around 1920, she had repetitive late decelerations due to tachysystole, which resolved after reducing Pitocin to 4 milliunits and position changes.  Subjective: Comfortable with epidural, has been resting intermittently. Denies pressure. Discussed r/b of AROM and pt. Agrees.   Objective: Pitocin infusing at 4 milliunits.  Vitals:   02/16/21 2000 02/16/21 2010 02/16/21 2030 02/16/21 2105  BP: 109/70  108/63   Pulse: 94  (!) 111   Resp:  18    Temp:    98.3 F (36.8 C)  TempSrc:    Oral  SpO2:      Weight:      Height:        I/O last 3 completed shifts: In: -  Out: 75 [Urine:75] No intake/output data recorded.   FHT:  FHR: 135 bpm, variability: moderate,  accelerations:  Present,  decelerations:  Present occasional early and variable UC:   regular, every 2.5-4 minutes SVE:   Dilation: 8 Effacement (%): 100 Station: -1 Exam by:: m Chrys Landgrebe, CNM AROM for large amount of moderate meconium fluid  Labs:   Recent Labs    02/16/21 1358  WBC 14.1*  HGB 12.9  HCT 37.9  PLT 231    Assessment / Plan: G2P0010 30 y.o. [redacted]w[redacted]d Spontaneous labor, with Pitocin augmentation and AROM, progressing well  Labor: Progressing normally  Fetal Wellbeing:  Category II Pain  Control:  Epidural  Anticipated MOD:  NSVD  Dr. [name] updated on patient status   Karena Addison, CNM, MSN 02/16/2021, 9:22 PM

## 2021-02-16 NOTE — Progress Notes (Signed)
Pt informed that the ultrasound is considered a limited OB ultrasound and is not intended to be a complete ultrasound exam.  Patient also informed that the ultrasound is not being completed with the intent of assessing for fetal or placental anomalies or any pelvic abnormalities.  Explained that the purpose of today's ultrasound is to assess for  presentation.  Patient acknowledges the purpose of the exam and the limitations of the study.    Cephalic  Josy Peaden, NP   

## 2021-02-16 NOTE — MAU Note (Signed)
Presents with ctxs 5 minutes apart that began @ 0530 this morning.  Denies VB or LOF.  Endorses +FM.

## 2021-02-17 ENCOUNTER — Encounter (HOSPITAL_COMMUNITY)
Admission: AD | Disposition: A | Discharge status: Home / Self Care | Payer: Self-pay | Source: Home / Self Care | Attending: Obstetrics & Gynecology

## 2021-02-17 ENCOUNTER — Encounter (HOSPITAL_COMMUNITY): Payer: Self-pay | Admitting: Obstetrics & Gynecology

## 2021-02-17 DIAGNOSIS — O411290 Chorioamnionitis, unspecified trimester, not applicable or unspecified: Secondary | ICD-10-CM | POA: Diagnosis not present

## 2021-02-17 DIAGNOSIS — Z8616 Personal history of COVID-19: Secondary | ICD-10-CM | POA: Diagnosis present

## 2021-02-17 DIAGNOSIS — O99892 Other specified diseases and conditions complicating childbirth: Secondary | ICD-10-CM | POA: Diagnosis present

## 2021-02-17 DIAGNOSIS — O9902 Anemia complicating childbirth: Secondary | ICD-10-CM | POA: Diagnosis not present

## 2021-02-17 DIAGNOSIS — O0933 Supervision of pregnancy with insufficient antenatal care, third trimester: Secondary | ICD-10-CM

## 2021-02-17 LAB — CBC
HCT: 29.7 % — ABNORMAL LOW (ref 36.0–46.0)
Hemoglobin: 9.7 g/dL — ABNORMAL LOW (ref 12.0–15.0)
MCH: 28.7 pg (ref 26.0–34.0)
MCHC: 32.7 g/dL (ref 30.0–36.0)
MCV: 87.9 fL (ref 80.0–100.0)
Platelets: 188 10*3/uL (ref 150–400)
RBC: 3.38 MIL/uL — ABNORMAL LOW (ref 3.87–5.11)
RDW: 13.2 % (ref 11.5–15.5)
WBC: 21.8 10*3/uL — ABNORMAL HIGH (ref 4.0–10.5)
nRBC: 0 % (ref 0.0–0.2)

## 2021-02-17 LAB — HEMOGLOBIN A1C
Hgb A1c MFr Bld: 5.4 % (ref 4.8–5.6)
Mean Plasma Glucose: 108 mg/dL

## 2021-02-17 SURGERY — Surgical Case
Anesthesia: Epidural

## 2021-02-17 MED ORDER — ONDANSETRON HCL 4 MG/2ML IJ SOLN: 4.0000 mg | Freq: Once | INTRAMUSCULAR | Status: DC | PRN

## 2021-02-17 MED ORDER — MORPHINE SULFATE (PF) 0.5 MG/ML IJ SOLN
INTRAMUSCULAR | Status: DC | PRN
  Administered 2021-02-17: 3 mg via EPIDURAL

## 2021-02-17 MED ORDER — LIDOCAINE-EPINEPHRINE (PF) 2 %-1:200000 IJ SOLN: INTRAMUSCULAR | Status: DC | PRN

## 2021-02-17 MED ORDER — ACETAMINOPHEN 160 MG/5ML PO SOLN: 325.0000 mg | ORAL | Status: DC | PRN

## 2021-02-17 MED ORDER — SODIUM CHLORIDE 0.9% FLUSH: 3.0000 mL | INTRAVENOUS | Status: DC | PRN

## 2021-02-17 MED ORDER — TETANUS-DIPHTH-ACELL PERTUSSIS 5-2.5-18.5 LF-MCG/0.5 IM SUSY: 0.5000 mL | PREFILLED_SYRINGE | Freq: Once | INTRAMUSCULAR | Status: DC

## 2021-02-17 MED ORDER — CEFAZOLIN SODIUM-DEXTROSE 2-4 GM/100ML-% IV SOLN
2.0000 g | INTRAVENOUS | Status: AC
  Administered 2021-02-17: 2 g via INTRAVENOUS

## 2021-02-17 MED ORDER — OXYTOCIN-SODIUM CHLORIDE 30-0.9 UT/500ML-% IV SOLN
INTRAVENOUS | Status: DC | PRN
  Administered 2021-02-17: 30 [IU] via INTRAVENOUS

## 2021-02-17 MED ORDER — IBUPROFEN 600 MG PO TABS
600.0000 mg | ORAL_TABLET | Freq: Four times a day (QID) | ORAL | Status: DC
  Administered 2021-02-18 – 2021-02-19 (×5): 600 mg via ORAL
  Filled 2021-02-17 (×5): qty 1

## 2021-02-17 MED ORDER — MEPERIDINE HCL 25 MG/ML IJ SOLN: 6.2500 mg | INTRAMUSCULAR | Status: DC | PRN

## 2021-02-17 MED ORDER — ZOLPIDEM TARTRATE 5 MG PO TABS
5.0000 mg | ORAL_TABLET | Freq: Every evening | ORAL | Status: DC | PRN
Start: 2021-02-17 — End: 2021-02-19

## 2021-02-17 MED ORDER — DIPHENHYDRAMINE HCL 25 MG PO CAPS: 25.0000 mg | ORAL_CAPSULE | ORAL | Status: DC | PRN

## 2021-02-17 MED ORDER — SODIUM CHLORIDE 0.9 % IV SOLN
INTRAVENOUS | Status: AC
  Filled 2021-02-17: qty 500

## 2021-02-17 MED ORDER — SIMETHICONE 80 MG PO CHEW
80.0000 mg | CHEWABLE_TABLET | Freq: Three times a day (TID) | ORAL | Status: DC
  Administered 2021-02-17 – 2021-02-19 (×5): 80 mg via ORAL
  Filled 2021-02-17 (×5): qty 1

## 2021-02-17 MED ORDER — NALOXONE HCL 0.4 MG/ML IJ SOLN: 0.4000 mg | INTRAMUSCULAR | Status: DC | PRN

## 2021-02-17 MED ORDER — OXYCODONE HCL 5 MG/5ML PO SOLN: 5.0000 mg | Freq: Once | ORAL | Status: DC | PRN

## 2021-02-17 MED ORDER — SODIUM CHLORIDE 0.9 % IV SOLN
INTRAVENOUS | Status: DC | PRN
  Administered 2021-02-17: 500 mg via INTRAVENOUS

## 2021-02-17 MED ORDER — OXYTOCIN-SODIUM CHLORIDE 30-0.9 UT/500ML-% IV SOLN
INTRAVENOUS | Status: AC
  Filled 2021-02-17: qty 500

## 2021-02-17 MED ORDER — NALBUPHINE HCL 10 MG/ML IJ SOLN: 5.0000 mg | Freq: Once | INTRAMUSCULAR | Status: DC | PRN

## 2021-02-17 MED ORDER — ACETAMINOPHEN 325 MG PO TABS: 650.0000 mg | ORAL_TABLET | ORAL | Status: DC | PRN

## 2021-02-17 MED ORDER — DIBUCAINE (PERIANAL) 1 % EX OINT: 1.0000 "application " | TOPICAL_OINTMENT | CUTANEOUS | Status: DC | PRN

## 2021-02-17 MED ORDER — ONDANSETRON HCL 4 MG/2ML IJ SOLN: 4.0000 mg | Freq: Three times a day (TID) | INTRAMUSCULAR | Status: DC | PRN

## 2021-02-17 MED ORDER — PHENYLEPHRINE 40 MCG/ML (10ML) SYRINGE FOR IV PUSH (FOR BLOOD PRESSURE SUPPORT)
PREFILLED_SYRINGE | INTRAVENOUS | Status: DC | PRN
  Administered 2021-02-17 (×2): 80 ug via INTRAVENOUS

## 2021-02-17 MED ORDER — FENTANYL CITRATE (PF) 100 MCG/2ML IJ SOLN
INTRAMUSCULAR | Status: AC
  Filled 2021-02-17: qty 2

## 2021-02-17 MED ORDER — MORPHINE SULFATE (PF) 0.5 MG/ML IJ SOLN
INTRAMUSCULAR | Status: AC
  Filled 2021-02-17: qty 10

## 2021-02-17 MED ORDER — ACETAMINOPHEN 325 MG PO TABS: 325.0000 mg | ORAL_TABLET | ORAL | Status: DC | PRN

## 2021-02-17 MED ORDER — ONDANSETRON HCL 4 MG/2ML IJ SOLN
INTRAMUSCULAR | Status: DC | PRN
  Administered 2021-02-17: 4 mg via INTRAVENOUS

## 2021-02-17 MED ORDER — KETOROLAC TROMETHAMINE 30 MG/ML IJ SOLN
30.0000 mg | Freq: Four times a day (QID) | INTRAMUSCULAR | Status: AC | PRN
  Administered 2021-02-17: 30 mg via INTRAMUSCULAR

## 2021-02-17 MED ORDER — KETOROLAC TROMETHAMINE 30 MG/ML IJ SOLN
30.0000 mg | Freq: Four times a day (QID) | INTRAMUSCULAR | Status: AC
  Administered 2021-02-17 – 2021-02-18 (×4): 30 mg via INTRAVENOUS
  Filled 2021-02-17 (×4): qty 1

## 2021-02-17 MED ORDER — POVIDONE-IODINE 10 % EX SWAB: 2.0000 "application " | Freq: Once | CUTANEOUS | Status: DC

## 2021-02-17 MED ORDER — CEFAZOLIN SODIUM-DEXTROSE 2-4 GM/100ML-% IV SOLN
INTRAVENOUS | Status: AC
  Filled 2021-02-17: qty 100

## 2021-02-17 MED ORDER — NALOXONE HCL 4 MG/10ML IJ SOLN
1.0000 ug/kg/h | INTRAVENOUS | Status: DC | PRN
  Filled 2021-02-17: qty 5

## 2021-02-17 MED ORDER — OXYCODONE HCL 5 MG PO TABS: 5.0000 mg | ORAL_TABLET | ORAL | Status: DC | PRN

## 2021-02-17 MED ORDER — POLYSACCHARIDE IRON COMPLEX 150 MG PO CAPS
150.0000 mg | ORAL_CAPSULE | Freq: Every day | ORAL | Status: DC
  Administered 2021-02-17 – 2021-02-19 (×3): 150 mg via ORAL
  Filled 2021-02-17 (×3): qty 1

## 2021-02-17 MED ORDER — MAGNESIUM OXIDE -MG SUPPLEMENT 400 (240 MG) MG PO TABS
400.0000 mg | ORAL_TABLET | Freq: Every day | ORAL | Status: DC
  Administered 2021-02-17 – 2021-02-19 (×3): 400 mg via ORAL
  Filled 2021-02-17 (×3): qty 1

## 2021-02-17 MED ORDER — OXYCODONE HCL 5 MG PO TABS
5.0000 mg | ORAL_TABLET | Freq: Once | ORAL | Status: DC | PRN
Start: 2021-02-17 — End: 2021-02-17

## 2021-02-17 MED ORDER — SENNOSIDES-DOCUSATE SODIUM 8.6-50 MG PO TABS
2.0000 | ORAL_TABLET | Freq: Every day | ORAL | Status: DC
  Administered 2021-02-18 – 2021-02-19 (×2): 2 via ORAL
  Filled 2021-02-17 (×2): qty 2

## 2021-02-17 MED ORDER — WITCH HAZEL-GLYCERIN EX PADS: 1.0000 "application " | MEDICATED_PAD | CUTANEOUS | Status: DC | PRN

## 2021-02-17 MED ORDER — SCOPOLAMINE 1 MG/3DAYS TD PT72: 1.0000 | MEDICATED_PATCH | Freq: Once | TRANSDERMAL | Status: DC

## 2021-02-17 MED ORDER — PRENATAL MULTIVITAMIN CH
1.0000 | ORAL_TABLET | Freq: Every day | ORAL | Status: DC
  Administered 2021-02-17 – 2021-02-19 (×3): 1 via ORAL
  Filled 2021-02-17 (×3): qty 1

## 2021-02-17 MED ORDER — LACTATED RINGERS IV SOLN: INTRAVENOUS | Status: DC

## 2021-02-17 MED ORDER — PHENYLEPHRINE 40 MCG/ML (10ML) SYRINGE FOR IV PUSH (FOR BLOOD PRESSURE SUPPORT)
PREFILLED_SYRINGE | INTRAVENOUS | Status: AC
  Filled 2021-02-17: qty 10

## 2021-02-17 MED ORDER — NALBUPHINE HCL 10 MG/ML IJ SOLN: 5.0000 mg | INTRAMUSCULAR | Status: DC | PRN

## 2021-02-17 MED ORDER — KETOROLAC TROMETHAMINE 30 MG/ML IJ SOLN
INTRAMUSCULAR | Status: AC
  Filled 2021-02-17: qty 1

## 2021-02-17 MED ORDER — COCONUT OIL OIL: 1.0000 "application " | TOPICAL_OIL | Status: DC | PRN

## 2021-02-17 MED ORDER — FENTANYL CITRATE (PF) 100 MCG/2ML IJ SOLN: 25.0000 ug | INTRAMUSCULAR | Status: DC | PRN

## 2021-02-17 MED ORDER — KETOROLAC TROMETHAMINE 30 MG/ML IJ SOLN: 30.0000 mg | Freq: Four times a day (QID) | INTRAMUSCULAR | Status: AC | PRN

## 2021-02-17 MED ORDER — ONDANSETRON HCL 4 MG/2ML IJ SOLN
INTRAMUSCULAR | Status: AC
  Filled 2021-02-17: qty 2

## 2021-02-17 MED ORDER — FENTANYL CITRATE (PF) 100 MCG/2ML IJ SOLN
INTRAMUSCULAR | Status: DC | PRN
  Administered 2021-02-17: 100 ug via EPIDURAL

## 2021-02-17 MED ORDER — OXYTOCIN-SODIUM CHLORIDE 30-0.9 UT/500ML-% IV SOLN: 2.5000 [IU]/h | INTRAVENOUS | Status: AC

## 2021-02-17 MED ORDER — DIPHENHYDRAMINE HCL 25 MG PO CAPS: 25.0000 mg | ORAL_CAPSULE | Freq: Four times a day (QID) | ORAL | Status: DC | PRN

## 2021-02-17 MED ORDER — MENTHOL 3 MG MT LOZG: 1.0000 | LOZENGE | OROMUCOSAL | Status: DC | PRN

## 2021-02-17 MED ORDER — DIPHENHYDRAMINE HCL 50 MG/ML IJ SOLN: 12.5000 mg | INTRAMUSCULAR | Status: DC | PRN

## 2021-02-17 MED ORDER — SODIUM CHLORIDE 0.9 % IV SOLN
2.0000 g | Freq: Four times a day (QID) | INTRAVENOUS | Status: AC
  Administered 2021-02-17 – 2021-02-18 (×4): 2 g via INTRAVENOUS
  Filled 2021-02-17 (×4): qty 2

## 2021-02-17 MED ORDER — DEXAMETHASONE SODIUM PHOSPHATE 10 MG/ML IJ SOLN
INTRAMUSCULAR | Status: AC
  Filled 2021-02-17: qty 1

## 2021-02-17 MED ORDER — SODIUM CHLORIDE 0.9 % IV SOLN: INTRAVENOUS | Status: DC | PRN

## 2021-02-17 MED ORDER — SIMETHICONE 80 MG PO CHEW: 80.0000 mg | CHEWABLE_TABLET | ORAL | Status: DC | PRN

## 2021-02-17 MED ORDER — DEXAMETHASONE SODIUM PHOSPHATE 10 MG/ML IJ SOLN
INTRAMUSCULAR | Status: DC | PRN
  Administered 2021-02-17: 10 mg via INTRAVENOUS

## 2021-02-17 MED ORDER — LIDOCAINE-EPINEPHRINE (PF) 2 %-1:200000 IJ SOLN
INTRAMUSCULAR | Status: DC | PRN
  Administered 2021-02-17 (×2): 5 mL via EPIDURAL

## 2021-02-17 SURGICAL SUPPLY — 34 items
BENZOIN TINCTURE PRP APPL 2/3 (GAUZE/BANDAGES/DRESSINGS) ×2 IMPLANT
CHLORAPREP W/TINT 26ML (MISCELLANEOUS) ×2 IMPLANT
CLAMP CORD UMBIL (MISCELLANEOUS) IMPLANT
CLOTH BEACON ORANGE TIMEOUT ST (SAFETY) ×2 IMPLANT
DRSG OPSITE POSTOP 4X10 (GAUZE/BANDAGES/DRESSINGS) ×2 IMPLANT
ELECT REM PT RETURN 9FT ADLT (ELECTROSURGICAL) ×2
ELECTRODE REM PT RTRN 9FT ADLT (ELECTROSURGICAL) ×1 IMPLANT
EXTRACTOR VACUUM KIWI (MISCELLANEOUS) IMPLANT
EXTRACTOR VACUUM M CUP 4 TUBE (SUCTIONS) IMPLANT
GLOVE BIO SURGEON STRL SZ7 (GLOVE) ×2 IMPLANT
GLOVE BIOGEL PI IND STRL 7.0 (GLOVE) ×2 IMPLANT
GLOVE BIOGEL PI INDICATOR 7.0 (GLOVE) ×2
GOWN STRL REUS W/TWL LRG LVL3 (GOWN DISPOSABLE) ×4 IMPLANT
KIT ABG SYR 3ML LUER SLIP (SYRINGE) IMPLANT
NEEDLE HYPO 25X5/8 SAFETYGLIDE (NEEDLE) IMPLANT
NS IRRIG 1000ML POUR BTL (IV SOLUTION) ×2 IMPLANT
PACK C SECTION WH (CUSTOM PROCEDURE TRAY) ×2 IMPLANT
PAD OB MATERNITY 4.3X12.25 (PERSONAL CARE ITEMS) ×2 IMPLANT
RTRCTR C-SECT PINK 25CM LRG (MISCELLANEOUS) IMPLANT
STRIP CLOSURE SKIN 1/2X4 (GAUZE/BANDAGES/DRESSINGS) ×2 IMPLANT
SUT MNCRL 0 VIOLET CTX 36 (SUTURE) ×2 IMPLANT
SUT MONOCRYL 0 CTX 36 (SUTURE) ×2
SUT PLAIN 0 NONE (SUTURE) IMPLANT
SUT PLAIN 2 0 (SUTURE)
SUT PLAIN ABS 2-0 CT1 27XMFL (SUTURE) IMPLANT
SUT VIC AB 0 CT1 27 (SUTURE) ×2
SUT VIC AB 0 CT1 27XBRD ANBCTR (SUTURE) ×2 IMPLANT
SUT VIC AB 2-0 CT1 27 (SUTURE) ×1
SUT VIC AB 2-0 CT1 TAPERPNT 27 (SUTURE) ×1 IMPLANT
SUT VIC AB 4-0 KS 27 (SUTURE) ×2 IMPLANT
SUT VICRYL 0 TIES 12 18 (SUTURE) IMPLANT
TOWEL OR 17X24 6PK STRL BLUE (TOWEL DISPOSABLE) ×2 IMPLANT
TRAY FOLEY W/BAG SLVR 14FR LF (SET/KITS/TRAYS/PACK) IMPLANT
WATER STERILE IRR 1000ML POUR (IV SOLUTION) ×2 IMPLANT

## 2021-02-17 NOTE — Op Note (Signed)
Cesarean Section Procedure Note - Amanda Pineda  02/17/2021 Procedure: Primary Low Transverse Cesarean Section   Indications:  Arrest of dilatation. Fetal intolerance to labor     Pre-operative Diagnosis: Fetal intolerance to labor.   Post-operative Diagnosis: Same   Surgeon:  Amanda Evans, MD - Primary   Assistants: Amanda Pineda CNM  Anesthesia: epidural   Procedure Details:  The patient was seen in the Labor Room. The risks, benefits, complications, treatment options, and expected outcomes were discussed with the patient. The patient concurred with the proposed plan, giving informed consent. identified as Amanda Pineda and the procedure verified as C-Section Delivery. A Time Out was held and the above information confirmed. 2 gm Ancef and 1 gm Azithromycin given.  After induction of anesthesia, the patient was draped and prepped in the usual sterile manner, foley was draining urine well.  As I was ready to start C/section, CRNA Amanda Pineda mentioned patient had temp of 100.2. A pfannenstiel incision was made and carried down through the subcutaneous tissue to the fascia. Fascial incision was made and extended transversely. The fascia was separated from the underlying rectus tissue superiorly and inferiorly. The peritoneum was identified and entered. Peritoneal incision was extended longitudinally. Alexis-O retractor placed. Bladder was noted high and swollen. The utero-vesical peritoneal reflection was incised transversely and the bladder flap was bluntly freed from the lower uterine segment. A low transverse uterine incision was made. Amniotic fluid with minimal meconium noted. Baby was in ROT, asynclitic presentation. Delivered from cephalic presentation was a FEMALE  infant with vigorous cry at 2.26 AM on 02/17/21/ Mouth and nares bulb suction at head delivery due to meconium in labor then delivery completed. Apgar scores of 8 at one minute and 9 at five minutes. Delayed cord clamping done  at 1 minute and baby handed to NICU team in attendance. Baby felt a bit warm and nursery RN was advised. Cord ph was not sent. Cord blood was obtained for evaluation. The placenta was removed Intact and appeared normal  but sent to path.  The uterine outline, tubes and ovaries appeared normal}. The uterine incision was closed with running locked sutures of 0 Monocryl. A second imbricating layer sutured.   Hemostasis was observed. Alexis retractor removed. Peritoneal closure done with 2-0 Vicryl.  The fascia was then reapproximated with running sutures of 0Vicryl. The subcuticular closure was not needed. The skin was closed with 4-0Vicryl.  Instrument, sponge, and needle counts were correct prior the abdominal closure and were correct at the conclusion of the case.   Findings: Female infant delivered from ROT asynclitic position at 2.26 AM on 02/17/21. Apgars 8 and 9. Maternal tachycardia and temp of 100.2 in OR, will treat with post-op antibiotic Cefoxitin for 24 hours for presumed chorioamnionitis, placenta to path. Hematuria started to clear at the end of surgery.    Estimated Blood Loss: 458 recorded and add 50 cc likely with fundal massage  Total IV Fluids:  LR 1500 ml   Urine Output:  100CC OF bloody urine clearing after head delivery  Specimens: cord blood and placenta   Complications: no complications  Disposition: PACU - hemodynamically stable.   Maternal Condition: stable   Baby condition / location:  Couplet care / Skin to Skin  Attending Attestation: I performed the procedure.   Signed: Surgeon(s): Amanda Evans, MD

## 2021-02-17 NOTE — Transfer of Care (Signed)
Immediate Anesthesia Transfer of Care Note  Patient: Amanda Pineda  Procedure(s) Performed: CESAREAN SECTION  Patient Location: PACU  Anesthesia Type:Epidural  Level of Consciousness: awake, alert  and oriented  Airway & Oxygen Therapy: Patient Spontanous Breathing  Post-op Assessment: Report given to RN and Post -op Vital signs reviewed and stable  Post vital signs: Reviewed and stable  Last Vitals:  Vitals Value Taken Time  BP 112/68 02/17/21 0317  Temp 37.7 C 02/17/21 0317  Pulse 105 02/17/21 0324  Resp 15 02/17/21 0324  SpO2 100 % 02/17/21 0324  Vitals shown include unvalidated device data.  Last Pain:  Vitals:   02/17/21 0317  TempSrc: Oral  PainSc:       Patients Stated Pain Goal: 0 (02/16/21 1429)  Complications: No notable events documented.

## 2021-02-17 NOTE — Progress Notes (Signed)
RN assisted patient to bathroom for peri-care at 1200. Patient was steady on her feet and ambulated well to bathroom. As patient was sitting down on the toilet her left knee buckled she caught herself and RN stabilized her right side. Patient stated that her left knee touched the ground. Patient denied any pain. Patient was returned to bed via [facility] and instructed to call for help getting up. Patient verbalized understanding. No swelling or discoloration of left knee noted. Post fall huddle done at patient bedside. Dorisann Frames CNM notified of incident.

## 2021-02-17 NOTE — Lactation Note (Signed)
This note was copied from a baby's chart. Lactation Consultation Note  Patient Name: Amanda Pineda Date: 02/17/2021 Reason for consult: Follow-up assessment;Mother's request;Difficult latch;Term Age:29 hours  Mom states struggling latching infant at the breast. Feeding improved at 1300 with 30 minute feeding according to mother.  On arrival, infant latched at the nipple and biting with his gums, holding his tongue back. Infant high palate. LC did some suck training with chin tug to extend his tongue.  Infant has thick labial attachment and lingual attachment. He can extend his tongue pass the gum line.   We changed position, from cradle to football with laid back position, flanging out lower lip. Mom stated with changes above pain reduced from 7 to 2.   Mom's nipples are short shafted and small. Mom will use breast shells to help elongate her nipples. Mom provided with breast shells to wear not pumping, sleeping or nursing. Parts assembly, usage and cleaning reviewed.   Mom can also pre pump with manual pump 5-10 minutes before latching.  Mom set up on DEBP pumping q 3 hrs for 15 min.   Plan 1.to feed based on cues 8-12x in 24 hr period no more than 3 hrs without an attempt. Mom to offer both breasts with compression noting signs of milk transfer.            2. If latch difficult, Mom to do hand expression or use her manual pump and offer drops of colostrum via spoon            3. Mom to pump DEBP as stated above.  All questions answered at the end of the visit.   Maternal Data Has patient been taught Hand Expression?: Yes  Feeding Mother's Current Feeding Choice: Breast Milk  LATCH Score Latch: Repeated attempts needed to sustain latch, nipple held in mouth throughout feeding, stimulation needed to elicit sucking reflex.  Audible Swallowing: A few with stimulation  Type of Nipple: Flat (short shafted and small)  Comfort (Breast/Nipple): Filling, red/small  blisters or bruises, mild/mod discomfort (LC assisted latching infant in prone position in football. Mom stated with changes and breast compression pain reduced 7 to 2.)  Hold (Positioning): Assistance needed to correctly position infant at breast and maintain latch.  LATCH Score: 5   Lactation Tools Discussed/Used Tools: Pump;Flanges Breast pump type: Manual Pump Education: Setup, frequency, and cleaning;Milk Storage Reason for Pumping: increase stimulation Pumping frequency: every 3 hrs for 10 minutes each breast  Interventions Interventions: Breast feeding basics reviewed;Breast compression;Assisted with latch;Adjust position;Hand pump;Skin to skin;Support pillows;DEBP;Breast massage;Position options;Hand express;Expressed milk;Education;Pre-pump if needed;Shells;Comfort gels  Discharge Pump: Personal  Consult Status Consult Status: Follow-up Date: 02/18/21 Follow-up type: In-patient    Yvetta Drotar  Nicholson-Springer 02/17/2021, 6:57 PM

## 2021-02-17 NOTE — Progress Notes (Signed)
Amanda Pineda is a 30 y.o. G2P0010 at [redacted]w[redacted]d by ultrasound admitted for latent labor.   Subjective: Comfortable with epidural. No complaints. Denies pressure or pain. s/p multiple Lavonne position changes.    Objective: Pitocin at 6 milliunits  Vitals:   02/16/21 2320 02/16/21 2330 02/17/21 0000 02/17/21 0030  BP:  (!) 99/58 99/61 119/78  Pulse:  91 91 93  Resp: 16 16 16    Temp:   98.9 F (37.2 C)   TempSrc:   Oral   SpO2:      Weight:      Height:        I/O last 3 completed shifts: In: -  Out: 75 [Urine:75] Total I/O In: -  Out: 300 [Urine:300]   FHT:  FHR: 135-140 bpm, variability: minimal, moderate,  accelerations:  Abscent,  decelerations:  Present recurrent late decelerations UC:   regular, every 2-4 minutes  IUPC inadequate, not tracing well at times SVE:   Dilation: 6.5 Effacement (%):  Station: -1 Exam by:: m Amanda Pineda, CNM Edematous cervix/ caput noted   Labs:   Recent Labs    02/16/21 1358  WBC 14.1*  HGB 12.9  HCT 37.9  PLT 231    Assessment / Plan: G2P0010 30 y.o. [redacted]w[redacted]d Arrest in active phase of labor due to fetal intolerance  Pitocin stopped at 0035  Fetal Wellbeing:  Category II Pain Control:  Epidural  Anticipated MOD:  guarded Dr. [redacted]w[redacted]d called in to assess for primary cesarean section for fetal intolerance to labor and arrest of dilation. Risk of C/S reviewed including infection, bleeding, poss need  for blood transfusion injury to surrounding organ structures. All questions answered.  Amanda Pineda, CNM, MSN  02/17/2021, 12:45 AM

## 2021-02-17 NOTE — Lactation Note (Signed)
This note was copied from a baby's chart. Lactation Consultation Note  Patient Name: Amanda Pineda OBSJG'G Date: 02/17/2021 Reason for consult: Initial assessment;Term;Primapara;1st time breastfeeding Age:29 hours   P1 mother whose infant is now 66 hours old.  This is a term baby at 39+5 weeks.  RN in room assisting with care when I arrived.  Baby awake and fussy in mother's arms.  Mother reported that she just fed him for 20 minutes, however, he was showing cues.  Offered to assist with latching and mother agreeable.  Taught hand expression; no colostrum drops noted at this time.  Attempted to latch, however, baby was not interested in sucking.  Burped well and swaddled baby.  Parents interested in resting at this time.  Reviewed breast feeding basics with parents.  Mother will feed 8-12 times/24 hours or sooner if baby shows cues.  Suggested she call her RN/LC for assistance as needed.  Mom made aware of O/P services, breastfeeding support groups, community resources, and our phone # for post-discharge questions.  Father present and supportive.   Maternal Data Has patient been taught Hand Expression?: Yes Does the patient have breastfeeding experience prior to this delivery?: No  Feeding Mother's Current Feeding Choice: Breast Milk  LATCH Score Latch: Too sleepy or reluctant, no latch achieved, no sucking elicited.  Audible Swallowing: None  Type of Nipple: Everted at rest and after stimulation  Comfort (Breast/Nipple): Soft / non-tender  Hold (Positioning): Assistance needed to correctly position infant at breast and maintain latch.  LATCH Score: 5   Lactation Tools Discussed/Used    Interventions Interventions: Breast feeding basics reviewed;Assisted with latch;Skin to skin;Hand express;Adjust position;Position options;Support pillows;Education  Discharge    Consult Status Consult Status: Follow-up Date: 02/18/21 Follow-up type: In-patient    Amanda Pineda  Amanda Pineda 02/17/2021, 7:03 AM

## 2021-02-17 NOTE — Progress Notes (Signed)
Amanda Pineda is a 30 y.o. G2P0010 at [redacted]w[redacted]d by ultrasound admitted for active labor Patient with poor prenatal care, who stopped coming for PNcare after 19 wk sono and didn't reschedule any visits after "no showed" at 23 wk visit and didn't return office calls to reschedule. Subjective: Comfortable with epidrual   Objective: BP 107/73   Pulse 99   Temp 98.9 F (37.2 C) (Oral)   Resp 18   Ht 5' (1.524 m)   Wt 70.3 kg   SpO2 99%   BMI 30.27 kg/m  I/O last 3 completed shifts: In: -  Out: 75 [Urine:75] Total I/O In: -  Out: 300 [Urine:300]  FHT:  FHR: 140 bpm, variability: minimal ,  accelerations:  Abscent,  decelerations:  Present intermittent late decels   Intermittent late decels since 7 pm would resolve and go back to category I and then with nromal UC pattern have late decels again. After stopping pitocin entirely in last 45 min, late decels have resolved but variability is minimal  UC:   regular, every 2-4 minutes SVE:   Dilation: 6.5 Effacement (%): 100 Station: -2 Exam by:: Dr. [name] [facility] in place. Swollen thick cervix  Hematuria noted as well   Labs: Lab Results  Component Value Date   WBC 14.1 (H) 02/16/2021   HGB 12.9 02/16/2021   HCT 37.9 02/16/2021   MCV 85.9 02/16/2021   PLT 231 02/16/2021    Assessment / Plan: Arrest in active phase of labor and fetal intolerance to contractions  D/w pt exam and FHT and recc C-section as we cannot continue pitocin. Cervix is swollen and head is asynclitic with large caput and moulding  Risks/complications of surgery reviewed incl infection, bleeding, damage to internal organs including bladder, bowels, ureters, blood vessels, other risks from anesthesia, VTE and delayed complications of any surgery, complications in future surgery reviewed. Also discussed neonatal complications incl difficult delivery, laceration, vacuum assistance, TTN etc. Pt understands and agrees, all concerns addressed.   Pt given informed written  consent   Robley Fries 02/17/2021, 1:43 AM

## 2021-02-17 NOTE — Lactation Note (Signed)
This note was copied from a baby's chart. Lactation Consultation Note  Patient Name: Amanda Pineda IRSWN'I Date: 02/17/2021   Age:29 hours   Initial LC Consult:  Attempted to visit with mother, however, she is asleep at this time.  Will return later today for initial consult.   Maternal Data    Feeding    LATCH Score Latch: Repeated attempts needed to sustain latch, nipple held in mouth throughout feeding, stimulation needed to elicit sucking reflex.  Audible Swallowing: A few with stimulation  Type of Nipple: Everted at rest and after stimulation  Comfort (Breast/Nipple): Filling, red/small blisters or bruises, mild/mod discomfort  Hold (Positioning): Assistance needed to correctly position infant at breast and maintain latch.  LATCH Score: 6   Lactation Tools Discussed/Used    Interventions    Discharge    Consult Status      Amanda Pineda R Amanda Pineda 02/17/2021, 5:37 AM

## 2021-02-17 NOTE — Progress Notes (Signed)
Subjective: POD# 0 INTERVAL NOTE Live born female  Birth Weight: 6 lb 11.4 oz (3045 g) APGAR: 8, 9   Newborn Delivery   Birth date/time: 02/17/2021 02:26:00 Delivery type: C-Section, Low Transverse Trial of labor: No C-section categorization: Primary     Baby name: Amanda Pineda Delivering provider: MODY, VAISHALI   Circumcision Yes, planning  Feeding: breast  Pain control at delivery: Epidural   Reports feeling well this morning. Denies pain. Bleeding is stable. Foley in place draining yellow urine. Partner, Molli Hazard, at the bedside.   Patient reports tolerating PO.   Pain controlled with prescription NSAID's including ketorolac (Toradol) Denies HA/SOB/C/P/N/V/dizziness. She reports vaginal bleeding as normal, without clots. Foley draining clear, yellow urine.   Objective:  Vitals:   02/17/21 0435 02/17/21 0542 02/17/21 0631 02/17/21 0747  BP: 110/62 105/67 110/77 108/67  Pulse: 78 61 66 71  Resp: 18 18 18 18   Temp: 98.7 F (37.1 C) 98.6 F (37 C) 98.5 F (36.9 C) 98.6 F (37 C)  TempSrc: Oral Oral Oral Oral  SpO2: 98% 98% 97% 98%  Weight:      Height:         Intake/Output Summary (Last 24 hours) at 02/17/2021 1003 Last data filed at 02/17/2021 02/19/2021 Gross per 24 hour  Intake 2090 ml  Output 1908 ml  Net 182 ml      Recent Labs    02/16/21 1358 02/17/21 0600  WBC 14.1* 21.8*  HGB 12.9 9.7*  HCT 37.9 29.7*  PLT 231 188    Blood type: --/--/O POS (06/27 1352)  Rubella: Immune (12/03 0000)   Vaccines:   TDaP          Declined                      COVID-19 Declined  Physical Exam:  General: alert and cooperative CV: Regular rate and rhythm Resp: clear Abdomen: soft, nontender, normal bowel sounds Incision: clean, dry, and intact Uterine Fundus: firm, below umbilicus, nontender Lochia: minimal Ext: extremities normal, atraumatic, no cyanosis or edema and no edema, redness or tenderness in the calves or thighs  Assessment/Plan: 30 y.o.   POD# 0. 37                   Principal Problem:   Postpartum care following cesarean delivery 6/28  Encourage rest when baby rests Breastfeeding support Encourage to ambulate Routine post-op care Active Problems:   Normal labor   Delivery by emergency cesarean NRFHTs and arrest of labor 6/28   Maternal anemia, with delivery  Asymptomatic  Start PO Niferex and mag oxide   Chorioamnionitis  Afebrile since 0100 this AM  WBC 21.8 on AM CBC  Continue Cefoxitin for 24 hours   History of COVID-19  Recent infection early June 2022  Currently asymptomatic   Prenatal care insufficient  Social work consult pending  July 2022, MSN 02/17/2021, 10:03 AM

## 2021-02-17 NOTE — Anesthesia Postprocedure Evaluation (Signed)
Anesthesia Post Note  Patient: Amanda Pineda  Procedure(s) Performed: CESAREAN SECTION     Patient location during evaluation: Mother Baby Anesthesia Type: Epidural Level of consciousness: awake and alert Pain management: pain level controlled Vital Signs Assessment: post-procedure vital signs reviewed and stable Respiratory status: spontaneous breathing, nonlabored ventilation and respiratory function stable Cardiovascular status: stable Postop Assessment: no headache, no backache and epidural receding Anesthetic complications: no   No notable events documented.  Last Vitals:  Vitals:   02/17/21 1148 02/17/21 1550  BP:  92/61  Pulse:  78  Resp:  20  Temp: 36.9 C 36.7 C  SpO2: 99% 99%    Last Pain:  Vitals:   02/17/21 1857  TempSrc:   PainSc: 0-No pain                 [name]

## 2021-02-18 NOTE — Clinical Social Work Maternal (Addendum)
CLINICAL SOCIAL WORK MATERNAL/CHILD NOTE  Patient Details  Name: Amanda Pineda MRN: [id number] Date of Birth: 03/06/1991  Date:  02/18/2021  Clinical Social Worker Initiating Note:  Nicole Sinclair, LCSW Date/Time: Initiated:  02/18/21/1023     Child's Name:  Amanda Pineda   Biological Parents:  Mother, Father   Need for Interpreter:  None   Reason for Referral:  Limited Prenatal Care     Address:  [address] [place] [address]    Phone number:  [phone] (home)     Additional phone number:   Household Members/Support Persons (HM/SP):   Household Member/Support Person 1   HM/SP Name Relationship DOB or Age  HM/SP -1 Amanda Pineda Signifiicant Other 03-17-1985  HM/SP -2        HM/SP -3        HM/SP -4        HM/SP -5        HM/SP -6        HM/SP -7        HM/SP -8          Natural Supports (not living in the home):  Immediate Family   Professional Supports:     Employment: Full-time   Type of Work: [employer]   Education:  High school graduate   Homebound arranged:    Financial Resources:      Other Resources:      Cultural/Religious Considerations Which May Impact Care:    Strengths:  Home prepared for child  , Ability to meet basic needs  , Pediatrician chosen   Psychotropic Medications:         Pediatrician:    [place] area  Pediatrician List:   [place]    [place] Other ([facility])  [place]    [place]     [place]    [place]      Pediatrician Fax Number:    Risk Factors/Current Problems:      Cognitive State:  Able to Concentrate  , Insightful  , Alert  , Linear Thinking     Mood/Affect:  Bright  , Comfortable  , Happy     CSW Assessment: CSW received consult for late prenatal care. CSW met with [name] to offer support and complete assessment.   CSW met with [name] at bedside. CSW observed [name] sitting up in bed watching the infant sleep near her. CSW  congratulated [name] and introduced CSW role. [name] calm and receptive to CSW visit. CSW confirmed [name] demographic information correct on file. CSW inquired about [name] household. [name] reports its just her and significant other/FOB.CSW inquired how [name] has felt since giving birth. [name] reports she has felt fine with no pain.  CSW inquired about [name] limited prenatal care. [name] reports her initial PNC visits were at [facility] however she stopped the visits because of financial challenges with affording the insurance co-pay. CSW informed [name] about the limited prenatal care-hospital drug screen policy. CSW will follow the UDS, CDS and notify CPS if warranted. [name] reports understanding.   CSW inquired if [name] has history of mental health. [name] reports no history with mental health.  CSW provided education regarding the baby blues period vs. perinatal mood disorders, discussed treatment and gave resources for mental health follow up if concerns arise. CSW recommended [name] complete a self-evaluation during the postpartum time period using the New Mom Checklist from Postpartum Progress and encouraged [name] to contact a medical professional if symptoms are noted   at any time. [name] agreeable to reach out if needed. CSW assessed [name] for safety. [name] denies thoughts of harm to self and others. CSW inquired about [name] supports. [name] acknowledges FOB and her mom as supports.   CSW provided review of Sudden Infant Death Syndrome (SIDS) precautions and informed [name] no co-sleeping with the infant. [name] reports the infant will sleep in a bassinet. CSW inquired if [name] has essential items for the infant. [name] reports she has essential items for the infant. [name] has chosen [facility] for infant's follow up care. CSW inquired if [name] has transportation to appointments [name] reports she has transportation. CSW inquired if [name] receives WIC/FS. [name] reports she does not currently have WIC/FS benefits however she is interested.  CSW provided [name] with WIC/FS information to follow up. [name] appreciative. CSW assessed [name] for further needs. [name] reports no further need.   CSW identifies no further need for intervention and no barriers to discharge at this time.  CSW Plan/Description:  Other Information/Referral to Community Resources, Sudden Infant Death Syndrome (SIDS) Education, Hospital Drug Screen Policy Information, Perinatal Mood and Anxiety Disorder (PMADs) Education, No Further Intervention Required/No Barriers to Discharge, CSW Will Continue to Monitor Umbilical Cord Tissue Drug Screen Results and Make Report if Warranted    Nicole A Sinclair, LCSW 02/18/2021, 10:27 AM  

## 2021-02-18 NOTE — Lactation Note (Signed)
This note was copied from a baby's chart. Lactation Consultation Note  Patient Name: Amanda Pineda YIRSW'N Date: 02/18/2021 Reason for consult: Follow-up assessment;1st time breastfeeding;Term (-4% weight loss) Age:29 hours infant had 7 voids and 7 stools since birth. Per mom, she is working on latch, she feels infant is improving, breast are sore but she feels only a tug now with latch. Mom has not been using DEBP, she has been working on latching infant at the breast, most feedings are now 20 to 30 minutes in length. LC did not observe latch with recent feeding, infant asleep in basinet and per  dad, infant recently BF for 21 minutes at 1430 pm. Mom takes infant off breast if she feels pain and not a tug. LC reviewed hand expression and mom easily expressed 7 mls of colostrum that is transitioning to mature milk. RN Ladona Ridgel ) will give mom coconut oil to help with soreness.  Mom doesn't have any other questions or concerns for LC at this time. Mom's plan: 1- She will continue to BF infant according to feeding cues. 2- She can occassionally hand express and give infant back extra volume of colostrum if she chooses ( her choice). 3- Mom will apply EBM and let dry on nipples or use cooconut oil to help with breast soreness. 4- Mom knows to call RN or LC if she has further breastfeeding, questions, concerns or needs latch assistance.  Maternal Data    Feeding Mother's Current Feeding Choice: Breast Milk  LATCH Score                    Lactation Tools Discussed/Used    Interventions Interventions: Skin to skin;Education;Expressed milk;Hand express  Discharge Discharge Education: Engorgement and breast care  Consult Status Consult Status: Follow-up Date: 02/19/21 Follow-up type: In-patient    Danelle Earthly 02/18/2021, 4:01 PM

## 2021-02-18 NOTE — Progress Notes (Addendum)
Subjective: POD# 1 Live born female  Birth Weight: 6 lb 11.4 oz (3045 g) APGAR: ,   Newborn Delivery   Birth date/time: 02/[birth year]/2021 02:26:00 Delivery type: C-Section, Low Transverse Trial of labor: No C-section categorization: Primary     Baby name: [name] Delivering provider: MODY, VAISHALI   circumcision planned inpatient Feeding: breast  Pain control at delivery: Epidural   Reports feeling well.  Patient reports tolerating PO.   Breast symptoms:no concerns w/ latch Pain controlled with  PO meds Denies HA/SOB/C/P/N/V/dizziness. Flatus absent. She reports vaginal bleeding as normal, without clots.  She is ambulating, no void yet since foley cath removed this am.     Objective:   VS:    Vitals:   02/18/21 0030 02/18/21 0230 02/18/21 0415 02/18/21 0600  BP:    93/64  Pulse:    66  Resp: 16 18 18 16   Temp:    97.9 F (36.6 C)  TempSrc:    Oral  SpO2:    99%  Weight:      Height:         Intake/Output Summary (Last 24 hours) at 02/18/2021 0940 Last data filed at 02/18/2021 0300 Gross per 24 hour  Intake 776.51 ml  Output 2600 ml  Net -1823.49 ml        Recent Labs    02/16/21 1358 02/17/21 0600  WBC 14.1* 21.8*  HGB 12.9 9.7*  HCT 37.9 29.7*  PLT 231 188     Blood type: --/--/O POS (06/27 1352)  Rubella: Immune (12/03 0000)  Vaccines: TDaP          declined         Flu  declined                     COVID-19 declined   Physical Exam:  General: alert, cooperative, and no distress CV: Regular rate and rhythm Resp: clear Abdomen: soft, nontender, decreased bowel sounds, mild distention Incision: clean, dry, and intact Uterine Fundus: firm, below umbilicus, nontender Lochia: minimal Ext: no edema, redness or tenderness in the calves or thighs  Assessment/Plan: 30 y.o.   POD# 1. [id number]                  Principal Problem:   Postpartum care following cesarean delivery 6/28 Active Problems:   Delivery by emergency cesarean NRFHTs and arrest of labor 6/28   Maternal anemia, with delivery  - asymptomatic   - started oral Fe and Mag Ox   Chorioamnionitis  - s/p 24 hrs ABX, afebrile   History of COVID-19   Prenatal care insufficient  - social consult pending  Doing well, stable.               Advance diet as tolerated Encourage rest when baby rests Breastfeeding support Encourage to ambulate Routine post-op care  Neta Mends, CNM, MSN 02/18/2021, 9:40 AM

## 2021-02-19 LAB — SURGICAL PATHOLOGY

## 2021-02-19 MED ORDER — POLYSACCHARIDE IRON COMPLEX 150 MG PO CAPS: 150.0000 mg | ORAL_CAPSULE | Freq: Every day | ORAL | Status: AC

## 2021-02-19 MED ORDER — COCONUT OIL OIL: 1.0000 "application " | TOPICAL_OIL | 0 refills | Status: AC | PRN

## 2021-02-19 MED ORDER — ACETAMINOPHEN 500 MG PO TABS: 1000.0000 mg | ORAL_TABLET | Freq: Four times a day (QID) | ORAL | 2 refills | Status: AC | PRN

## 2021-02-19 MED ORDER — MAGNESIUM OXIDE -MG SUPPLEMENT 400 (240 MG) MG PO TABS: 400.0000 mg | ORAL_TABLET | Freq: Every day | ORAL | Status: AC

## 2021-02-19 MED ORDER — IBUPROFEN 600 MG PO TABS: 600.0000 mg | ORAL_TABLET | Freq: Four times a day (QID) | ORAL | 0 refills | Status: AC

## 2021-02-19 MED ORDER — OXYCODONE HCL 5 MG PO TABS: 5.0000 mg | ORAL_TABLET | Freq: Four times a day (QID) | ORAL | 0 refills | Status: AC | PRN

## 2021-02-19 MED ORDER — SIMETHICONE 80 MG PO CHEW: 80.0000 mg | CHEWABLE_TABLET | ORAL | 0 refills | Status: AC | PRN

## 2021-02-19 MED ORDER — SENNOSIDES-DOCUSATE SODIUM 8.6-50 MG PO TABS: 2.0000 | ORAL_TABLET | Freq: Every day | ORAL | Status: AC

## 2021-02-19 NOTE — Lactation Note (Signed)
This note was copied from a baby's chart. Lactation Consultation Note  Patient Name: Amanda Pineda EQAST'M Date: 02/19/2021 Reason for consult: Follow-up assessment;Primapara;1st time breastfeeding;Term Age:29 hours  LC in to room for follow up. Parents are expecting discharge after circumcision and 24h. Discussed normal newborn behavior and patterns in addition to clusterfeeding. Talked about milk coming into volume. Provided comfort gels for nipple soreness.  Plan: 1-Skin to skin 2-Aim for a deep, comfortable latch 3-Breastfeeding on demand or 8-12 times in 24h period. 4-Keep infant awake during breastfeeding session: massaging breast, infant's hand/shoulder/feet 5-Monitor voids and stools as signs good intake.  6-Encouraged maternal rest, hydration and food intake.  7-Contact LC as needed for feeds/support/concerns/questions   All questions answered at this time. Reviewed LC brochure and [employer] booklet.     Feeding Mother's Current Feeding Choice: Breast Milk  Interventions Interventions: Breast feeding basics reviewed;Skin to skin;Breast massage;Hand express;DEBP;Ice;Comfort gels;Expressed milk;Education  Discharge Discharge Education: Engorgement and breast care;Warning signs for feeding baby Pump: Personal;DEBP;Manual  Consult Status Consult Status: Complete Date: 02/19/21 Follow-up type: Call as needed    Aubree Doody A Higuera Ancidey 02/19/2021, 1:44 PM

## 2021-02-19 NOTE — Discharge Summary (Signed)
OB Discharge Summary  Patient Name: Amanda Pineda DOB: [birth year] MRN: [id number]  Date of admission: 02/16/2021 Delivering provider: MODY, VAISHALI   Admitting diagnosis: Normal labor [O80, Z37.9] Delivery by emergency cesarean [O99.892] Intrauterine pregnancy: [redacted]w[redacted]d     Secondary diagnosis: Patient Active Problem List   Diagnosis Date Noted   Delivery by emergency cesarean NRFHTs and arrest of labor 6/28 02/17/2021   Maternal anemia, with delivery 02/17/2021   Chorioamnionitis 02/17/2021   Postpartum care following cesarean delivery 6/28 02/17/2021   History of COVID-19 02/17/2021   Prenatal care insufficient 02/17/2021   Additional problems:none   Date of discharge: 02/19/2021   Discharge diagnosis: Principal Problem:   Postpartum care following cesarean delivery 6/28 Active Problems:   Delivery by emergency cesarean NRFHTs and arrest of labor 6/28   Maternal anemia, with delivery   Chorioamnionitis   History of COVID-19   Prenatal care insufficient                                                              Post partum procedures: none  Augmentation: AROM Pain control: Epidural  Laceration:None  Episiotomy:None  Complications: None  Hospital course:  Onset of Labor With Unplanned C/S   30 y.o. yo G2P1011 at [redacted]w[redacted]d was admitted in Latent Labor on 02/16/2021. Patient had a labor course significant for arrest in  active phase of labor and fetal intolerance to contractions. The patient went for cesarean section due to Arrest of Dilation and Non-Reassuring FHR.  Delivery details as follows: Membrane Rupture Time/Date: 8:56 PM ,02/16/2021   Delivery Method:C-Section, Low Transverse  Details of operation can be found in separate operative note. Patient had an uncomplicated postpartum course. Of note, mternal tachycardia and temp of 100.2 in OR, treated with post-op antibiotic Cefoxitin for 24 hours for presumed chorioamnionitis. She is ambulating,tolerating a regular diet, passing flatus, and urinating well.  Patient is discharged home in stable condition 02/19/21.  Newborn Data: Birth date:02/17/2021  Birth time:2:26 AM  Gender:Female  Living status:Living  Apgars:8, 9 Weight:3045 g   Physical exam  Vitals:   02/18/21 0415 02/18/21 0600 02/18/21 1347 02/19/21 0515  BP:  93/64 92/64 (!) 104/92  Pulse:  66 75 61  Resp: 18 16 20 18   Temp:  97.9 F (36.6 C) 98.4 F (36.9 C) 98 F (36.7 C)  TempSrc:  Oral Oral Oral  SpO2:  99% 100%   Weight:      Height:       General: alert, cooperative, and no distress Lochia: appropriate Uterine Fundus: firm Incision: Dressing is clean, dry, and intact DVT Evaluation: No cords or calf tenderness. No significant calf/ankle edema. Labs: Lab Results  Component Value Date   WBC 21.8 (H) 02/17/2021   HGB 9.7 (L) 02/17/2021   HCT 29.7 (L) 02/17/2021   MCV 87.9 02/17/2021   PLT 188 02/17/2021   CMP Latest Ref Rng & Units 02/16/2021  Glucose 70 - 99 mg/dL 79  BUN 6 - 20 mg/dL 02/18/2021)  Creatinine <6(L - 1.00 mg/dL 8.93  Sodium 7.34 - 287 mmol/L 139  Potassium 3.5 - 5.1 mmol/L 4.0  Chloride 98 - 111 mmol/L 106  CO2 22 - 32 mmol/L 20(L)  Calcium 8.9 - 10.3 mg/dL 9.6  Total Protein 6.5 - 8.1 g/dL 7.1  Total Bilirubin 0.3 - 1.2 mg/dL 0.7  Alkaline Phos 38 - 126 U/L 121  AST 15 - 41 U/L 33  ALT 0 - 44 U/L 12   Edinburgh Postnatal Depression Scale Screening Tool  02/17/2021  I have been able to laugh and see the funny side of things. (No Data)   Vaccines: TDaP          declined         Flu             declined                    COVID-19 declined  Discharge instruction:  per After Visit Summary,  [facility] booklet and  "Understanding Mother [facility] Baby Care" hospital booklet  After Visit Meds:  Allergies as of 02/19/2021   No Known Allergies      Medication List     TAKE these medications    acetaminophen 500 MG tablet Commonly known as: TYLENOL Take 2 tablets (1,000 mg total) by mouth every 6 (six) hours as needed.   coconut oil Oil Apply 1 application topically as needed.   ibuprofen 600 MG tablet Commonly known as: ADVIL Take 1 tablet (600 mg total) by mouth every 6 (six) hours.   iron polysaccharides 150 MG capsule Commonly known as: Ferrex 150 Take 1 capsule (150 mg total) by mouth daily.   magnesium oxide 400 (240 Mg) MG tablet Commonly known as: MAG-OX Take 1 tablet (400 mg total) by mouth daily. For prevention of constipation.   oxyCODONE 5 MG immediate release tablet Commonly known as: Oxy IR/ROXICODONE Take 1 tablet (5 mg total) by mouth every 6 (six) hours as needed  for up to 5 days for moderate pain.   prenatal vitamin w/FE, FA 27-1 MG Tabs tablet Take 1 tablet by mouth daily at 12 noon.   senna-docusate 8.6-50 MG tablet Commonly known as: Senokot-S Take 2 tablets by mouth daily.   simethicone 80 MG chewable tablet Commonly known as: MYLICON Chew 1 tablet (80 mg total) by mouth as needed for flatulence.        Diet: routine diet  Activity: Advance as tolerated. Pelvic rest for 6 weeks.   Postpartum contraception: Condoms  Newborn Data: Live born female  Birth Weight: 6 lb 11.4 oz (3045 g) APGAR: ,   Newborn Delivery   Birth date/time: 02/17/2021 02:26:00 Delivery type: C-Section, Low Transverse Trial of labor: No C-section categorization: Primary      named [name] Baby Feeding:  Breast Disposition:home with mother Circumcision: inpatient/Dr. Almquist   Delivery Report:  Review the Delivery Report for details.    Follow up:  Follow-up Information     Shea Evans, MD. Schedule an appointment as soon as possible for a visit in 6 week(s).   Specialty: Obstetrics and Gynecology Why: For Postpartum follow-up Contact information: [address] [place] [address] [phone]                   Signed: Neta Mends, CNM, MSN 02/19/2021, 10:59 AM

## 2021-02-19 NOTE — Discharge Instructions (Signed)
Lactation outpatient support - home visit  Linda Coppola RN, MHA, IBCLC at [facility]: Lactation Consultant  [email or web address] Mail: [email or web address] Tel: [phone]    Additional resources:  [facility] [email or web address]   Chiropractic specialist   Dr. Leanna Hastings [email or web address]  Craniosacral therapy for baby  Erin Balkind  [email or web address]  

## 2021-02-27 ENCOUNTER — Telehealth (HOSPITAL_COMMUNITY): Payer: Self-pay | Admitting: *Deleted

## 2021-02-27 NOTE — Telephone Encounter (Signed)
Left message to return nurse call. Duffy Rhody, RN 02/27/2021 at 1:50pm

## 2022-01-23 ENCOUNTER — Emergency Department (HOSPITAL_BASED_OUTPATIENT_CLINIC_OR_DEPARTMENT_OTHER): Payer: Medicaid Other

## 2022-01-23 ENCOUNTER — Encounter (HOSPITAL_BASED_OUTPATIENT_CLINIC_OR_DEPARTMENT_OTHER): Payer: Self-pay | Admitting: Emergency Medicine

## 2022-01-23 ENCOUNTER — Other Ambulatory Visit: Payer: Self-pay

## 2022-01-23 ENCOUNTER — Emergency Department (HOSPITAL_BASED_OUTPATIENT_CLINIC_OR_DEPARTMENT_OTHER)
Admission: EM | Admit: 2022-01-23 | Discharge: 2022-01-23 | Disposition: A | Discharge status: Home / Self Care | Payer: Medicaid Other | Attending: Emergency Medicine | Admitting: Emergency Medicine

## 2022-01-23 DIAGNOSIS — O209 Hemorrhage in early pregnancy, unspecified: Secondary | ICD-10-CM | POA: Diagnosis not present

## 2022-01-23 DIAGNOSIS — O4690 Antepartum hemorrhage, unspecified, unspecified trimester: Secondary | ICD-10-CM

## 2022-01-23 DIAGNOSIS — Z3A01 Less than 8 weeks gestation of pregnancy: Secondary | ICD-10-CM | POA: Insufficient documentation

## 2022-01-23 LAB — CBC WITH DIFFERENTIAL/PLATELET
Abs Immature Granulocytes: 0.04 10*3/uL (ref 0.00–0.07)
Basophils Absolute: 0 10*3/uL (ref 0.0–0.1)
Basophils Relative: 0 %
Eosinophils Absolute: 0.1 10*3/uL (ref 0.0–0.5)
Eosinophils Relative: 1 %
HCT: 35.2 % — ABNORMAL LOW (ref 36.0–46.0)
Hemoglobin: 11.8 g/dL — ABNORMAL LOW (ref 12.0–15.0)
Immature Granulocytes: 0 %
Lymphocytes Relative: 21 %
Lymphs Abs: 2.6 10*3/uL (ref 0.7–4.0)
MCH: 28.1 pg (ref 26.0–34.0)
MCHC: 33.5 g/dL (ref 30.0–36.0)
MCV: 83.8 fL (ref 80.0–100.0)
Monocytes Absolute: 0.5 10*3/uL (ref 0.1–1.0)
Monocytes Relative: 4 %
Neutro Abs: 9.2 10*3/uL — ABNORMAL HIGH (ref 1.7–7.7)
Neutrophils Relative %: 74 %
Platelets: 313 10*3/uL (ref 150–400)
RBC: 4.2 MIL/uL (ref 3.87–5.11)
RDW: 14.1 % (ref 11.5–15.5)
WBC: 12.3 10*3/uL — ABNORMAL HIGH (ref 4.0–10.5)
nRBC: 0 % (ref 0.0–0.2)

## 2022-01-23 LAB — URINALYSIS, ROUTINE W REFLEX MICROSCOPIC
Bilirubin Urine: NEGATIVE
Glucose, UA: NEGATIVE mg/dL
Ketones, ur: NEGATIVE mg/dL
Leukocytes,Ua: NEGATIVE
Nitrite: NEGATIVE
Protein, ur: NEGATIVE mg/dL
Specific Gravity, Urine: 1.02 (ref 1.005–1.030)
pH: 7 (ref 5.0–8.0)

## 2022-01-23 LAB — COMPREHENSIVE METABOLIC PANEL
ALT: 9 U/L (ref 0–44)
AST: 19 U/L (ref 15–41)
Albumin: 4.1 g/dL (ref 3.5–5.0)
Alkaline Phosphatase: 92 U/L (ref 38–126)
Anion gap: 5 (ref 5–15)
BUN: 11 mg/dL (ref 6–20)
CO2: 27 mmol/L (ref 22–32)
Calcium: 9 mg/dL (ref 8.9–10.3)
Chloride: 104 mmol/L (ref 98–111)
Creatinine, Ser: 0.77 mg/dL (ref 0.44–1.00)
GFR, Estimated: >60 mL/min (ref >60)
Glucose, Bld: 94 mg/dL (ref 70–99)
Potassium: 3.5 mmol/L (ref 3.5–5.1)
Sodium: 136 mmol/L (ref 135–145)
Total Bilirubin: 0.4 mg/dL (ref 0.3–1.2)
Total Protein: 7.6 g/dL (ref 6.5–8.1)

## 2022-01-23 LAB — HCG, QUANTITATIVE, PREGNANCY: hCG, Beta Chain, Quant, S: 6037 m[IU]/mL — ABNORMAL HIGH (ref <5)

## 2022-01-23 LAB — URINALYSIS, MICROSCOPIC (REFLEX)

## 2022-01-23 LAB — PREGNANCY, URINE: Preg Test, Ur: POSITIVE — AB

## 2022-01-23 NOTE — ED Provider Notes (Signed)
[facility] EMERGENCY DEPARTMENT Provider Note   CSN: [id number] Arrival date & time: 01/23/22  1836     History  Chief Complaint  Patient presents with   Vaginal Bleeding    Amanda Pineda is a 31 y.o. female.   Vaginal Bleeding  Patient G3, P1 presents today due to vaginal bleeding during pregnancy.  Patient's last menstrual cycle was 12/05/2021.  She believes she is about 6 to [redacted] weeks pregnant.  Yesterday she started having vaginal spotting, states the bleeding has been getting worse with associated with abdominal cramping earlier today which subsided.  She has had 1 live birth, 1 early miscarriage.  She is not on any medicines other than prenatal vitamins.  Currently does not have any lower abdominal pain, no new blood thinners.  She is status post C-section, no other abdominal surgeries.  Home Medications Prior to Admission medications   Medication Sig Start Date End Date Taking? Authorizing Provider  acetaminophen (TYLENOL) 500 MG tablet Take 2 tablets (1,000 mg total) by mouth every 6 (six) hours as needed. 02/19/21 02/19/22  Neta MendsPaul, Daniela C, CNM  coconut oil OIL Apply 1 application topically as needed. 02/19/21   Neta MendsPaul, Daniela C, CNM  ibuprofen (ADVIL) 600 MG tablet Take 1 tablet (600 mg total) by mouth every 6 (six) hours. 02/19/21   Neta MendsPaul, Daniela C, CNM  iron polysaccharides (FERREX 150) 150 MG capsule Take 1 capsule (150 mg total) by mouth daily. 02/19/21   Neta MendsPaul, Daniela C, CNM  magnesium oxide (MAG-OX) 400 (240 Mg) MG tablet Take 1 tablet (400 mg total) by mouth daily. For prevention of constipation. 02/19/21   Neta MendsPaul, Daniela C, CNM  prenatal vitamin w/FE, FA (PRENATAL 1 + 1) 27-1 MG TABS tablet Take 1 tablet by mouth daily at 12 noon.    [provider]  senna-docusate (SENOKOT-S) 8.6-50 MG tablet Take 2 tablets by mouth daily. 02/19/21   Neta MendsPaul, Daniela C, CNM  simethicone (MYLICON) 80 MG chewable tablet Chew 1 tablet (80 mg total) by mouth as needed for  flatulence. 02/19/21   Neta MendsPaul, Daniela C, CNM      Allergies    Patient has no known allergies.    Review of Systems   Review of Systems  Genitourinary:  Positive for vaginal bleeding.   Physical Exam Updated Vital Signs BP 108/77 (BP Location: Right Arm)   Pulse 87   Resp 18   Ht 5' (1.524 m)   Wt 61.2 kg   LMP 12/05/2021   SpO2 100%   BMI 26.37 kg/m  Physical Exam Vitals and nursing note reviewed. Exam conducted with a chaperone present.  Constitutional:      Appearance: Normal appearance.  HENT:     Head: Normocephalic and atraumatic.  Eyes:     General: No scleral icterus.       Right eye: No discharge.        Left eye: No discharge.     Extraocular Movements: Extraocular movements intact.     Pupils: Pupils are equal, round, and reactive to light.  Cardiovascular:     Rate and Rhythm: Normal rate and regular rhythm.     Pulses: Normal pulses.     Heart sounds: Normal heart sounds. No murmur heard.   No friction rub. No gallop.  Pulmonary:     Effort: Pulmonary effort is normal. No respiratory distress.     Breath sounds: Normal breath sounds.  Abdominal:     General: Abdomen is flat. Bowel sounds are  normal. There is no distension.     Palpations: Abdomen is soft.     Tenderness: There is no abdominal tenderness.  Genitourinary:    Comments: Blood pooling in the vaginal canal, blood coming from cervical os but os not open Skin:    General: Skin is warm and dry.     Coloration: Skin is not jaundiced.  Neurological:     Mental Status: She is alert. Mental status is at baseline.     Coordination: Coordination normal.    ED Results / Procedures / Treatments   Labs (all labs ordered are listed, but only abnormal results are displayed) Labs Reviewed  URINALYSIS, ROUTINE W REFLEX MICROSCOPIC - Abnormal; Notable for the following components:      Result Value   Hgb urine dipstick LARGE (*)    All other components within normal limits  PREGNANCY, URINE -  Abnormal; Notable for the following components:   Preg Test, Ur POSITIVE (*)    All other components within normal limits  URINALYSIS, MICROSCOPIC (REFLEX) - Abnormal; Notable for the following components:   Bacteria, UA FEW (*)    All other components within normal limits  CBC WITH DIFFERENTIAL/PLATELET - Abnormal; Notable for the following components:   WBC 12.3 (*)    Hemoglobin 11.8 (*)    HCT 35.2 (*)    Neutro Abs 9.2 (*)    All other components within normal limits  HCG, QUANTITATIVE, PREGNANCY - Abnormal; Notable for the following components:   hCG, Beta Chain, Quant, S 6,037 (*)    All other components within normal limits  COMPREHENSIVE METABOLIC PANEL    EKG None  Radiology US OB LESS THAN 14 WEEKS WITH OB TRANSVAGINAL  Result Date: 01/23/2022 CLINICAL DATA:  [id number]. Vaginal bleeding, early pregnancy. Gestational age by last menstrual period 7 weeks and 0 days. Estimated due date 09/11/2022. Last menstrual period 12/05/2021 EXAM: OBSTETRIC <14 WK [place] AND TRANSVAGINAL OB US TECHNIQUE: Both transabdominal and transvaginal ultrasound examinations were performed for complete evaluation of the gestation as well as the maternal uterus, adnexal regions, and pelvic cul-de-sac. Transvaginal technique was performed to assess early pregnancy. COMPARISON:  None Available. FINDINGS: Intrauterine gestational sac: None Yolk sac:  Not Visualized. Embryo:  Not Visualized. Cardiac Activity: Not Visualized. Maternal uterus/adnexae: The left ovary is unremarkable. A corpus luteum cyst is noted within the right ovary. No definite adnexal mass. No complex ovarian lesion. The uterus is unremarkable within the endometrium measuring 0.7 cm. Other: Trace free fluid within the pelvis which may be physiologic. IMPRESSION: Non-localization of the pregnancy on this scan. No intrauterine gestational sac. No abnormal ovarian or adnexal masses. Differential diagnosis includes intrauterine gestation too early to  visualize, spontaneous abortion, or occult ectopic gestation. Recommend close clinical follow-up and serial serum beta-HCG monitoring, with repeat obstetric scan as warranted by beta-HCG levels and clinical assessment. Electronically Signed   By: Tish Frederickson M.D.   On: 01/23/2022 20:51    Procedures Procedures    Medications Ordered in ED Medications - No data to display  ED Course/ Medical Decision Making/ A&P                           Medical Decision Making Amount and/or Complexity of Data Reviewed Labs: ordered. Radiology: ordered.   Patient presents with vaginal bleeding during first trimester pregnancy.  Differential includes but not limited to threatened abortion, miscarriage, ectopic pregnancy rupture.  On exam her abdomen is soft without  any rigidity or guarding.  Pelvic exam the cervical os is closed but there is using blood.  There is no adnexal tenderness.  I ordered and reviewed laboratory work-up.  Per my interpretation Patient has a slight leukocytosis of 12.3 with a left shift, stable anemia with a hemoglobin of 11.8.  hCG 2 6037.  UA shows hematuria.  I ordered and reviewed the pelvic ultrasound OB less than 14 with transvaginal.  Unable to visualize the gestational sac, agree with radiologist interpretation.  Repeat abdominal exams are benign.  Patient is well-appearing, mentating well.  Blood pressure is soft but she has not become hypotensive.  No tachycardia reflective bradycardia.  Differential this point includes ectopic (although less likely - I suspect to early to visualize sac), threatened abortion or miscarriage.  Patient is able to follow-up with her gynecologist on Monday, we discussed very strict return precautions which are listed in her discharge instructions as well as discussed with her and her husband in the room.  At this time I do not feel she needs additional work-up or observation.  Patient was discharged in stable condition.  Discussed HPI,  physical exam and plan of care for this patient with attending Cathren Laine. The attending physician evaluated this patient as part of a shared visit and agrees with plan of care.         Final Clinical Impression(s) / ED Diagnoses Final diagnoses:  Vaginal bleeding in pregnancy    Rx / DC Orders ED Discharge Orders     None         Theron Arista, Cordelia Poche 01/23/22 2213    Cathren Laine, MD 01/23/22 2332

## 2022-01-23 NOTE — Discharge Instructions (Signed)
see your OB Monday. You will need the HCG rechecked. if you have severe abdominal pain, feel like you are ogin got pass out or actually lose consciousness you need to be seen in the ED.

## 2022-01-23 NOTE — ED Notes (Signed)
Pt provided a urine sample.

## 2022-01-23 NOTE — ED Notes (Addendum)
Will try to ask if pt can provide urine sample when going back to exam room, unable to collect one at this time.

## 2022-01-23 NOTE — ED Triage Notes (Signed)
Pt reports spotting since yesterday; lower mid pelvic pain on the way here; thinks she is about [redacted] wks pregnant

## 2022-06-30 LAB — OB RESULTS CONSOLE RPR
RPR: NONREACTIVE
RPR: NONREACTIVE

## 2022-06-30 LAB — OB RESULTS CONSOLE GC/CHLAMYDIA
Chlamydia: NEGATIVE
Chlamydia: NEGATIVE
Neisseria Gonorrhea: NEGATIVE
Neisseria Gonorrhea: NEGATIVE

## 2022-06-30 LAB — OB RESULTS CONSOLE HIV ANTIBODY (ROUTINE TESTING)
HIV: NONREACTIVE
HIV: NONREACTIVE

## 2022-06-30 LAB — HEPATITIS C ANTIBODY
HCV Ab: NEGATIVE
HCV Ab: NEGATIVE

## 2022-06-30 LAB — OB RESULTS CONSOLE HEPATITIS B SURFACE ANTIGEN
Hepatitis B Surface Ag: NEGATIVE
Hepatitis B Surface Ag: NEGATIVE

## 2022-08-23 NOTE — L&D Delivery Note (Signed)
Delivery Note Called to cover delivery as MD in the OR Patient completed and pushing well.  Pushed over about 5 contractions and delivered  At 1:49 AM a viable and healthy female was delivered via Vaginal, Spontaneous (Presentation: Left Occiput Anterior).  APGAR: 9, 9; weight 6 lb 5.9 oz (2890 g).   Placenta status: Spontaneous, Intact.  Cord: 3 vessels with the following complications: Nuchal cord x 1 tight, delivered through it.  Anesthesia: Epidural Episiotomy: None Lacerations: 2nd degree Suture Repair: 3.0 vicryl rapide Est. Blood Loss (mL): 107  Mom to postpartum.  Baby to Couplet care / Skin to Skin.  Wynelle Bourgeois 01/28/2023, 4:54 AM

## 2023-01-06 IMAGING — US US OB < 14 WEEKS - US OB TV
1 series · 13 of 28 positions shown · non-contrast
Comparison: None Available.

CLINICAL DATA: [id number]. Vaginal bleeding, early pregnancy.
Gestational age by last menstrual period 7 weeks and 0 days.
Estimated due date 09/11/2022. Last menstrual period 12/05/2021

EXAM:
OBSTETRIC <14 WK US AND TRANSVAGINAL OB US
TECHNIQUE: Both transabdominal and transvaginal ultrasound examinations were
performed for complete evaluation of the gestation as well as the
maternal uterus, adnexal regions, and pelvic cul-de-sac.
Transvaginal technique was performed to assess early pregnancy.

[Series 1: us ob < 14 weeks - us ob tv · 13 of 177 slices shown]
[im 7/177]
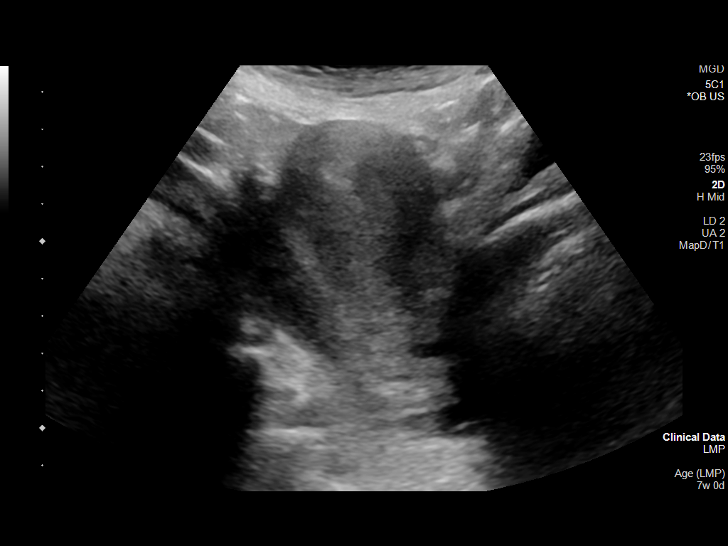
[im 20/177]
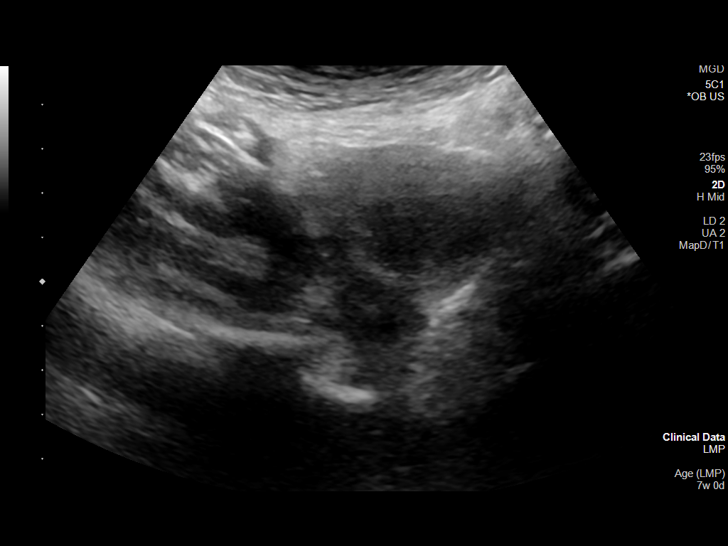
[im 33/177]
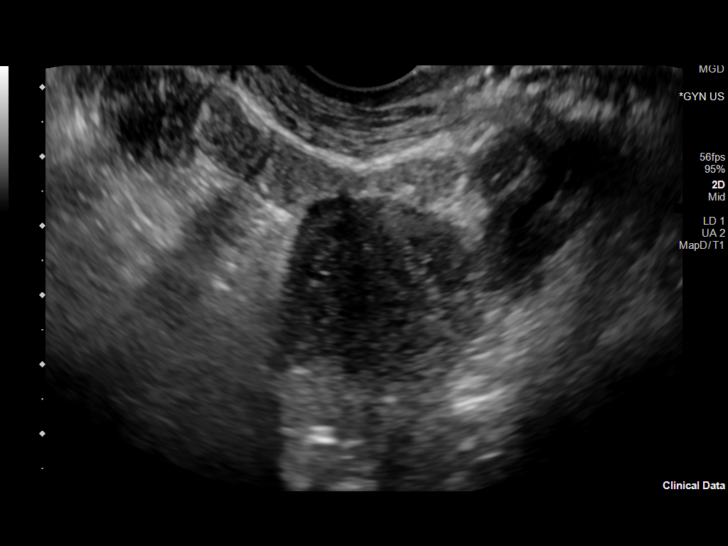
[im 46/177]
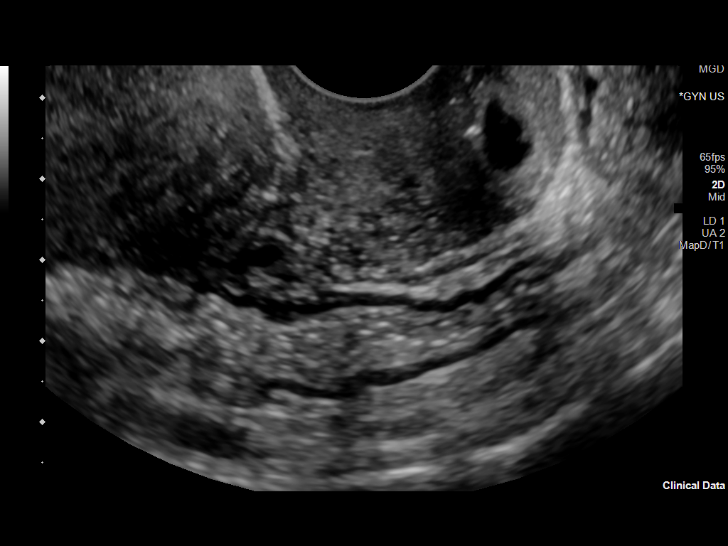
[im 59/177]
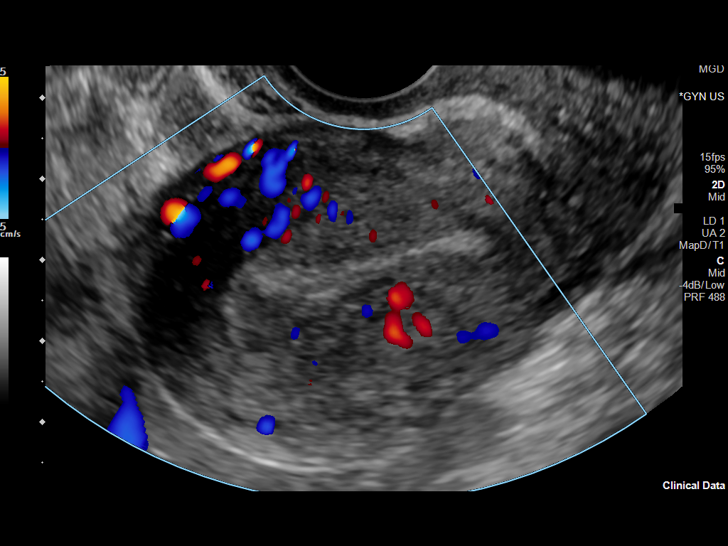
[im 72/177]
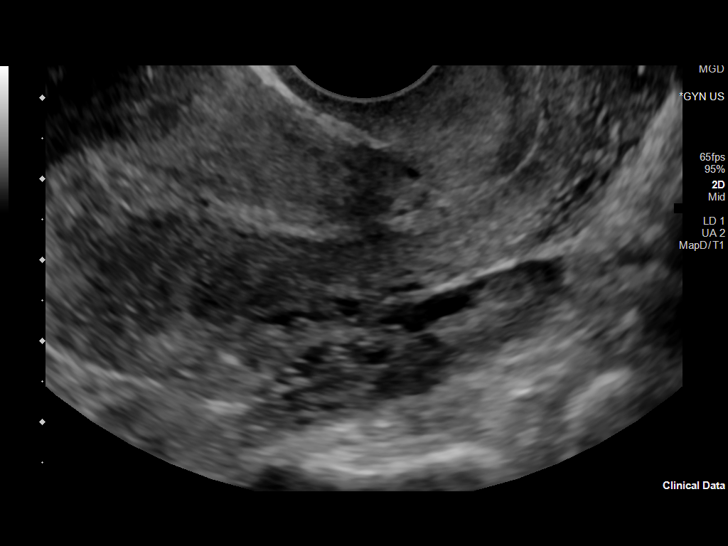
[im 92/177]
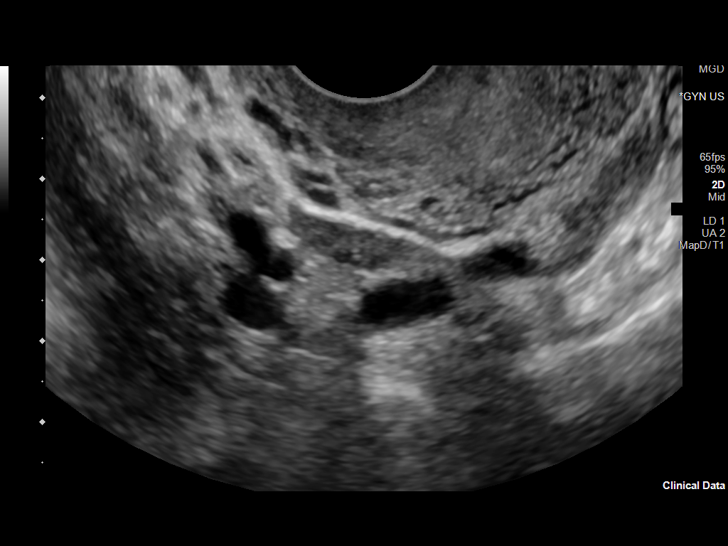
[im 105/177]
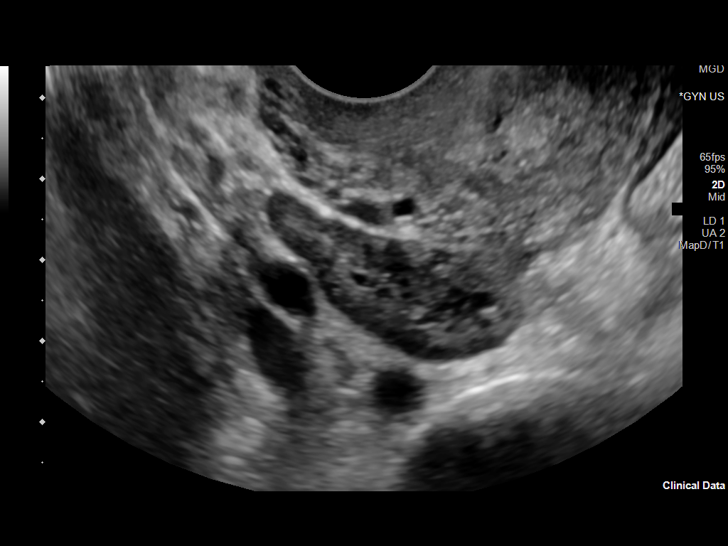
[im 118/177]
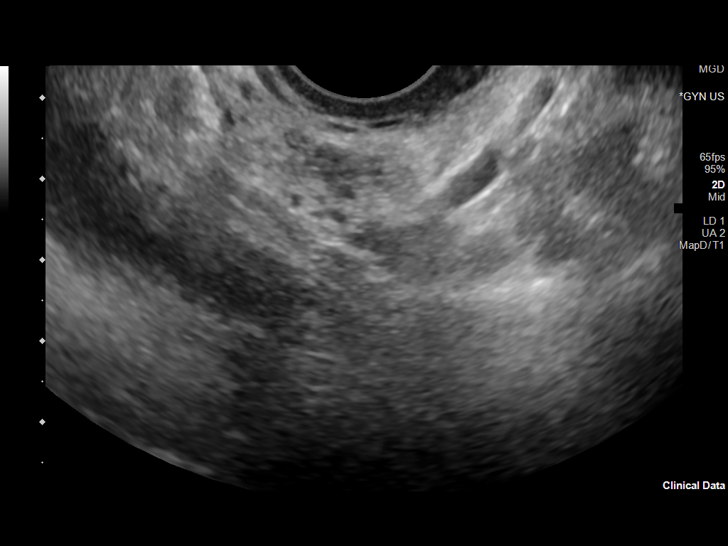
[im 131/177]
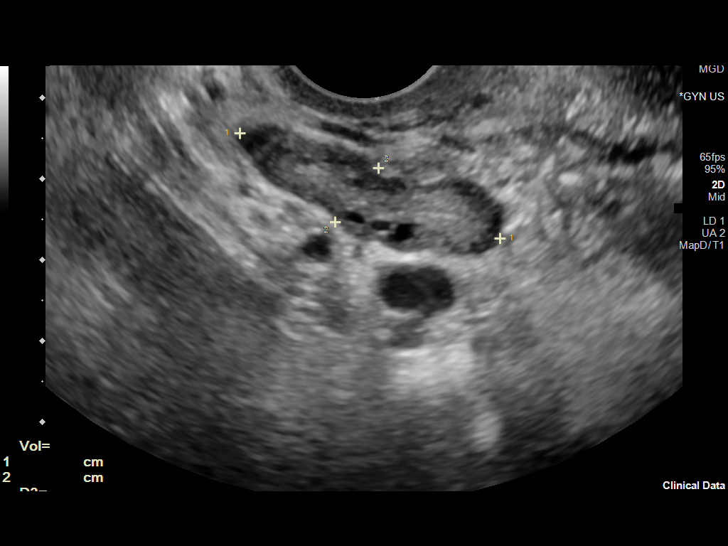
[im 144/177]
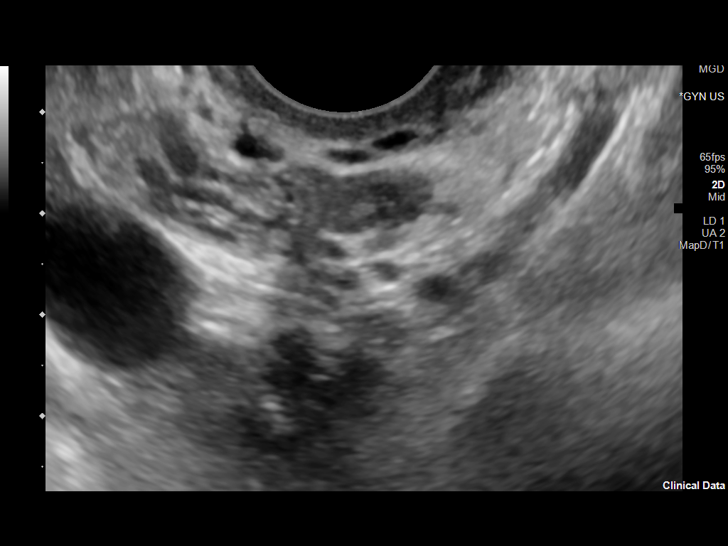
[im 157/177]
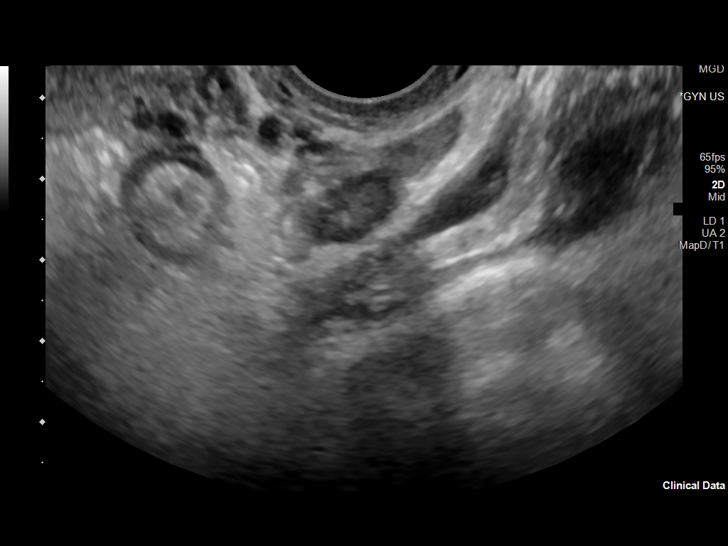
[im 170/177]
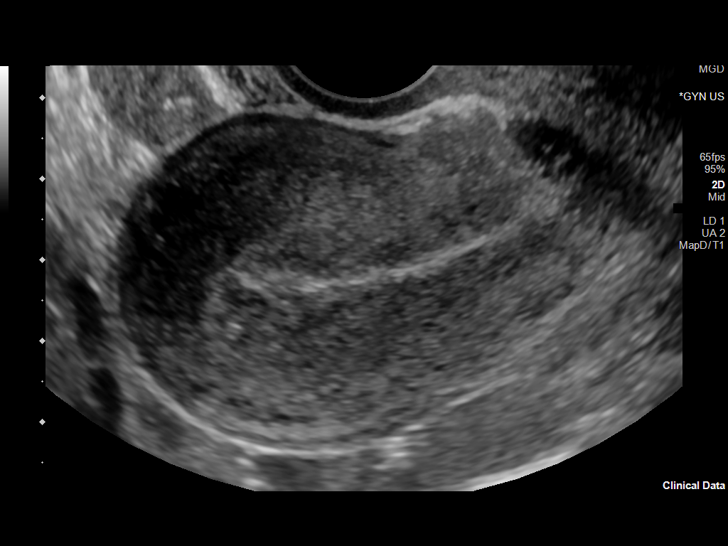

[13 of 28 positions shown; findings below may reference images not displayed]

FINDINGS: Intrauterine gestational sac: None

Yolk sac:  Not Visualized.

Embryo:  Not Visualized.

Cardiac Activity: Not Visualized.

Maternal uterus/adnexae: The left ovary is unremarkable. A corpus
luteum cyst is noted within the right ovary. No definite adnexal
mass. No complex ovarian lesion. The uterus is unremarkable within
the endometrium measuring 0.7 cm.

Other: Trace free fluid within the pelvis which may be physiologic.
IMPRESSION: Non-localization of the pregnancy on this scan. No intrauterine
gestational sac. No abnormal ovarian or adnexal masses. Differential
diagnosis includes intrauterine gestation too early to visualize,
spontaneous abortion, or occult ectopic gestation. Recommend close
clinical follow-up and serial serum beta-HCG monitoring, with repeat
obstetric scan as warranted by beta-HCG levels and clinical
assessment.

## 2023-01-27 ENCOUNTER — Inpatient Hospital Stay (HOSPITAL_COMMUNITY): Payer: MEDICAID | Admitting: Anesthesiology

## 2023-01-27 ENCOUNTER — Encounter (HOSPITAL_COMMUNITY): Payer: Self-pay | Admitting: Obstetrics and Gynecology

## 2023-01-27 ENCOUNTER — Other Ambulatory Visit: Payer: Self-pay

## 2023-01-27 ENCOUNTER — Inpatient Hospital Stay (HOSPITAL_COMMUNITY)
Admission: AD | Admit: 2023-01-27 | Discharge: 2023-01-30 | DRG: 807 | Disposition: A | Discharge status: Home / Self Care | Payer: MEDICAID | Attending: Obstetrics and Gynecology | Admitting: Obstetrics and Gynecology

## 2023-01-27 DIAGNOSIS — O34219 Maternal care for unspecified type scar from previous cesarean delivery: Secondary | ICD-10-CM | POA: Diagnosis present

## 2023-01-27 DIAGNOSIS — O0933 Supervision of pregnancy with insufficient antenatal care, third trimester: Secondary | ICD-10-CM

## 2023-01-27 DIAGNOSIS — Z3A39 39 weeks gestation of pregnancy: Secondary | ICD-10-CM

## 2023-01-27 DIAGNOSIS — Z8616 Personal history of COVID-19: Secondary | ICD-10-CM

## 2023-01-27 DIAGNOSIS — Z98891 History of uterine scar from previous surgery: Secondary | ICD-10-CM

## 2023-01-27 DIAGNOSIS — O479 False labor, unspecified: Principal | ICD-10-CM | POA: Insufficient documentation

## 2023-01-27 DIAGNOSIS — O26893 Other specified pregnancy related conditions, third trimester: Secondary | ICD-10-CM | POA: Diagnosis present

## 2023-01-27 LAB — CBC
HCT: 33.4 % — ABNORMAL LOW (ref 36.0–46.0)
Hemoglobin: 10.6 g/dL — ABNORMAL LOW (ref 12.0–15.0)
MCH: 25.2 pg — ABNORMAL LOW (ref 26.0–34.0)
MCHC: 31.7 g/dL (ref 30.0–36.0)
MCV: 79.3 fL — ABNORMAL LOW (ref 80.0–100.0)
Platelets: 287 10*3/uL (ref 150–400)
RBC: 4.21 MIL/uL (ref 3.87–5.11)
RDW: 13.3 % (ref 11.5–15.5)
WBC: 14 10*3/uL — ABNORMAL HIGH (ref 4.0–10.5)
nRBC: 0 % (ref 0.0–0.2)

## 2023-01-27 LAB — HEMOGLOBIN A1C
Hgb A1c MFr Bld: 5.3 % (ref 4.8–5.6)
Mean Plasma Glucose: 105.41 mg/dL

## 2023-01-27 LAB — GROUP B STREP BY PCR: Group B strep by PCR: NEGATIVE

## 2023-01-27 LAB — TYPE AND SCREEN
ABO/RH(D): O POS
Antibody Screen: NEGATIVE

## 2023-01-27 LAB — RAPID HIV SCREEN (HIV 1/2 AB+AG)
HIV 1/2 Antibodies: NONREACTIVE
HIV-1 P24 Antigen - HIV24: NONREACTIVE

## 2023-01-27 MED ORDER — LACTATED RINGERS IV SOLN: 500.0000 mL | INTRAVENOUS | Status: DC | PRN

## 2023-01-27 MED ORDER — BUPIVACAINE HCL (PF) 0.25 % IJ SOLN
INTRAMUSCULAR | Status: DC | PRN
  Administered 2023-01-27 (×2): 4 mL via EPIDURAL

## 2023-01-27 MED ORDER — ACETAMINOPHEN 325 MG PO TABS: 650.0000 mg | ORAL_TABLET | ORAL | Status: DC | PRN

## 2023-01-27 MED ORDER — OXYTOCIN-SODIUM CHLORIDE 30-0.9 UT/500ML-% IV SOLN
2.5000 [IU]/h | INTRAVENOUS | Status: DC
  Filled 2023-01-27: qty 500

## 2023-01-27 MED ORDER — PHENYLEPHRINE 80 MCG/ML (10ML) SYRINGE FOR IV PUSH (FOR BLOOD PRESSURE SUPPORT): 80.0000 ug | PREFILLED_SYRINGE | INTRAVENOUS | Status: DC | PRN

## 2023-01-27 MED ORDER — SOD CITRATE-CITRIC ACID 500-334 MG/5ML PO SOLN: 30.0000 mL | ORAL | Status: DC | PRN

## 2023-01-27 MED ORDER — FENTANYL-BUPIVACAINE-NACL 0.5-0.125-0.9 MG/250ML-% EP SOLN
12.0000 mL/h | EPIDURAL | Status: DC | PRN
  Administered 2023-01-27: 12 mL/h via EPIDURAL

## 2023-01-27 MED ORDER — ONDANSETRON HCL 4 MG/2ML IJ SOLN: 4.0000 mg | Freq: Four times a day (QID) | INTRAMUSCULAR | Status: DC | PRN

## 2023-01-27 MED ORDER — OXYCODONE-ACETAMINOPHEN 5-325 MG PO TABS: 1.0000 | ORAL_TABLET | ORAL | Status: DC | PRN

## 2023-01-27 MED ORDER — LACTATED RINGERS IV SOLN: INTRAVENOUS | Status: DC

## 2023-01-27 MED ORDER — LIDOCAINE HCL (PF) 1 % IJ SOLN
INTRAMUSCULAR | Status: DC | PRN
  Administered 2023-01-27: 5 mL via EPIDURAL
  Administered 2023-01-27: 3 mL via EPIDURAL

## 2023-01-27 MED ORDER — DIPHENHYDRAMINE HCL 50 MG/ML IJ SOLN: 12.5000 mg | INTRAMUSCULAR | Status: DC | PRN

## 2023-01-27 MED ORDER — EPHEDRINE 5 MG/ML INJ: 10.0000 mg | INTRAVENOUS | Status: DC | PRN

## 2023-01-27 MED ORDER — LACTATED RINGERS IV BOLUS
1000.0000 mL | Freq: Once | INTRAVENOUS | Status: AC
  Administered 2023-01-27: 1000 mL via INTRAVENOUS

## 2023-01-27 MED ORDER — LIDOCAINE HCL (PF) 1 % IJ SOLN
30.0000 mL | INTRAMUSCULAR | Status: AC | PRN
  Administered 2023-01-28: 30 mL via SUBCUTANEOUS
  Filled 2023-01-27: qty 30

## 2023-01-27 MED ORDER — OXYCODONE-ACETAMINOPHEN 5-325 MG PO TABS: 2.0000 | ORAL_TABLET | ORAL | Status: DC | PRN

## 2023-01-27 MED ORDER — DIPHENHYDRAMINE HCL 50 MG/ML IJ SOLN
25.0000 mg | Freq: Once | INTRAMUSCULAR | Status: AC
  Administered 2023-01-27: 25 mg via INTRAVENOUS
  Filled 2023-01-27: qty 1

## 2023-01-27 MED ORDER — FENTANYL-BUPIVACAINE-NACL 0.5-0.125-0.9 MG/250ML-% EP SOLN
EPIDURAL | Status: AC
  Filled 2023-01-27: qty 250

## 2023-01-27 MED ORDER — OXYTOCIN BOLUS FROM INFUSION
333.0000 mL | Freq: Once | INTRAVENOUS | Status: AC
  Administered 2023-01-28: 333 mL via INTRAVENOUS

## 2023-01-27 MED ORDER — LACTATED RINGERS IV SOLN: 500.0000 mL | Freq: Once | INTRAVENOUS | Status: DC

## 2023-01-27 NOTE — Progress Notes (Signed)
Labor Progress Note  Teyonce Primeaux is a 32 y.o. [phone] at [redacted]w[redacted]d presented for Labor  S: Pt feeling intense contractions, ok with AROM  O:  BP 110/70   Pulse (!) 122   Temp 98.4 F (36.9 C) (Oral)   Resp 17   Ht 5' (1.524 m)   Wt 71.5 kg   LMP 04/25/2022 (Exact Date)   SpO2 99%   BMI 30.80 kg/m  EFM:145 bpm/Moderate variability/ 15x15 accels/ Early decels CAT: 1 Toco: regular, every 2-3 minutes   CVE: Dilation: 7 Effacement (%): 70 Cervical Position: Posterior Station: -1 Presentation: Vertex Exam by:: Mercardo-ortiz DO   A&P: 32 y.o. [id number] [redacted]w[redacted]d  here for Labor as above  #Labor: Progressing well. AROM MSF 2158. Anticipate VBAC #Pain: Family/Friend support and Epidural #FWB: CAT 1 #GBS negative  Myrtie Hawk, DO FMOB Fellow, Faculty practice [facility], Center for [facility] : [facility] 01/27/23  11:21 PM

## 2023-01-27 NOTE — Anesthesia Procedure Notes (Signed)
Epidural Patient location during procedure: OB Start time: 01/27/2023 4:16 PM End time: 01/27/2023 4:21 PM  Staffing Anesthesiologist: Linton Rump, MD Performed: anesthesiologist   Preanesthetic Checklist Completed: patient identified, IV checked, site marked, risks and benefits discussed, surgical consent, monitors and equipment checked, pre-op evaluation and timeout performed  Epidural Patient position: sitting Prep: DuraPrep and site prepped and draped Patient monitoring: continuous pulse ox and blood pressure Approach: midline Location: L3-L4 Injection technique: LOR saline  Needle:  Needle type: Tuohy  Needle gauge: 17 G Needle length: 9 cm and 9 Needle insertion depth: 5 cm Catheter type: closed end flexible Catheter size: 19 Gauge Catheter at skin depth: 9 cm Test dose: negative  Assessment Events: blood not aspirated, no cerebrospinal fluid, injection not painful, no injection resistance, no paresthesia and negative IV test  Additional Notes The patient has requested an epidural for labor pain management. Risks and benefits including, but not limited to, infection, bleeding, local anesthetic toxicity, headache, hypotension, back pain, block failure, etc. were discussed with the patient. The patient expressed understanding and consented to the procedure. I confirmed that the patient has no bleeding disorders and is not taking blood thinners. I confirmed the patient's last platelet count with the nurse. A time-out was performed immediately prior to the procedure. Please see nursing documentation for vital signs. Sterile technique was used throughout the whole procedure. Once LOR achieved, the epidural catheter threaded easily without resistance. Aspiration of the catheter was negative for blood and CSF. The epidural was dosed slowly and an infusion was started.  1 attempt(s)Reason for block:procedure for pain

## 2023-01-27 NOTE — Progress Notes (Addendum)
Amanda Pineda is a 32 y.o. [id number] at [redacted]w[redacted]d by LMP admitted for labor.  Subjective: -Sitting up in throne position, pain managed well with epidural -Well supported by husband - desires to continue with TOLAC  Objective: BP 120/87   Pulse (!) 109   Temp 98.2 F (36.8 C) (Oral)   Resp 17   Ht 5' (1.524 m)   Wt 71.5 kg   LMP 04/25/2022 (Exact Date)   SpO2 99%   BMI 30.80 kg/m  No intake/output data recorded. No intake/output data recorded.  FHT:  FHR: 140 bpm, variability: moderate,  accelerations:  Present,  decelerations:  Present early UC:   regular, every 3-4 minutes SVE:  deferred  Labs: Lab Results  Component Value Date   WBC 14.0 (H) 01/27/2023   HGB 10.6 (L) 01/27/2023   HCT 33.4 (L) 01/27/2023   MCV 79.3 (L) 01/27/2023   PLT 287 01/27/2023    Assessment / Plan: Spontaneous labor, progressing normally  Labor: Progressing normally Preeclampsia:   none Fetal Wellbeing:  Category I Pain Control:  Epidural I/D:   GBS neg Anticipated MOD:  NSVD  Amanda Pineda, Student-MidWife 01/27/2023, 7:36 PM  Midwife attestation I agree with the documentation in the student's note.   Donette Larry, CNM 8:08 PM

## 2023-01-27 NOTE — MAU Provider Note (Signed)
Chief Complaint:  Contractions   Event Date/Time   First Provider Initiated Contact with Patient 01/27/23 1053      HPI: Amanda Pineda is a 32 y.o. G4P1021 at [redacted]w[redacted]d by LMP, c/w 9 week [place] who presents to maternity admissions reporting onset of painful contractions this morning at 0600. Pregnancy is complicated by no prenatal care, hx cesarean for NRFS and failure to progress.  Pt reports a lot going on in her life as reasons for not establishing care. She desires TOLAC if she is in labor today.  She is feeling normal fetal movement and denies LOF or vaginal bleeding.  HPI  Past Medical History: Past Medical History:  Diagnosis Date   Medical history non-contributory     Past obstetric history: OB History  Gravida Para Term Preterm AB Living  4 1 1   2 1   SAB IAB Ectopic Multiple Live Births  2     0 1    # Outcome Date GA Lbr Len/2nd Weight Sex Delivery Anes PTL Lv  4 Current           3 Term 02/17/21 [redacted]w[redacted]d  3045 g M CS-LTranv EPI  LIV  2 SAB           1 SAB             Past Surgical History: Past Surgical History:  Procedure Laterality Date   CESAREAN SECTION N/A 02/17/2021   Procedure: CESAREAN SECTION;  Surgeon: Shea Evans, MD;  Location: [facility];  Service: Obstetrics;  Laterality: N/A;   OTHER SURGICAL HISTORY     cyst over eye?   TONSILLECTOMY      Family History: History reviewed. No pertinent family history.  Social History: Social History   Tobacco Use   Smoking status: Never   Smokeless tobacco: Never  Vaping Use   Vaping Use: Never used  Substance Use Topics   Alcohol use: Not Currently    Comment: Socially   Drug use: Never    Allergies: No Known Allergies  Meds:  Medications Prior to Admission  Medication Sig Dispense Refill Last Dose   prenatal vitamin w/FE, FA (PRENATAL 1 + 1) 27-1 MG TABS tablet Take 1 tablet by mouth daily at 12 noon.   01/26/2023   coconut oil OIL Apply 1 application topically as needed.  0 Unknown    ibuprofen (ADVIL) 600 MG tablet Take 1 tablet (600 mg total) by mouth every 6 (six) hours. 30 tablet 0 Unknown   iron polysaccharides (FERREX 150) 150 MG capsule Take 1 capsule (150 mg total) by mouth daily.   Unknown   magnesium oxide (MAG-OX) 400 (240 Mg) MG tablet Take 1 tablet (400 mg total) by mouth daily. For prevention of constipation. 30 tablet  Unknown   senna-docusate (SENOKOT-S) 8.6-50 MG tablet Take 2 tablets by mouth daily.   Unknown   simethicone (MYLICON) 80 MG chewable tablet Chew 1 tablet (80 mg total) by mouth as needed for flatulence. 30 tablet 0 Unknown    ROS:  Review of Systems  Constitutional:  Negative for chills, fatigue and fever.  Eyes:  Negative for visual disturbance.  Respiratory:  Negative for shortness of breath.   Cardiovascular:  Negative for chest pain.  Gastrointestinal:  Positive for abdominal pain. Negative for nausea and vomiting.  Genitourinary:  Positive for pelvic pain. Negative for difficulty urinating, dysuria, flank pain, vaginal bleeding, vaginal discharge and vaginal pain.  Musculoskeletal:  Positive for back pain.  Neurological:  Negative  for dizziness and headaches.  Psychiatric/Behavioral: Negative.       I have reviewed patient's Past Medical Hx, Surgical Hx, Family Hx, Social Hx, medications and allergies.   Physical Exam  Patient Vitals for the past 24 hrs:  BP Temp Temp src Pulse Resp SpO2 Height Weight  01/27/23 1200 -- 98.2 F (36.8 C) Oral -- -- -- -- --  01/27/23 1031 110/79 -- -- 94 -- -- -- --  01/27/23 1012 -- -- Oral 87 17 100 % 5' (1.524 m) 71.5 kg   Constitutional: Well-developed, well-nourished female in no acute distress.  Cardiovascular: normal rate Respiratory: normal effort GI: Abd soft, non-tender, gravid appropriate for gestational age.  MS: Extremities nontender, no edema, normal ROM Neurologic: Alert and oriented x 4.  GU: Neg CVAT.  PELVIC EXAM:   Dilation: 1 Effacement (%): 70 Cervical Position:  Posterior Station: Ballotable Presentation: Vertex Exam by:: K. Cowher RN  FHT:  Baseline 130 , moderate variability, accelerations present, no decelerations Contractions: q 3-4 mins, moderate to palpation   Labs: Results for orders placed or performed during the hospital encounter of 01/27/23 (from the past 24 hour(s))  Group B strep by PCR     Status: None   Collection Time: 01/27/23 11:15 AM   Specimen: PATH Cytology Cervicovaginal Ancillary Only; Genital  Result Value Ref Range   Group B strep by PCR NEGATIVE NEGATIVE      Imaging:  No results found.  MAU Course/MDM: Orders Placed This Encounter  Procedures   Group B strep by PCR   Contraction - monitoring   External fetal heart monitoring   Vaginal exam    Meds ordered this encounter  Medications   lactated ringers bolus 1,000 mL     NST reviewed and nonreactive on arrival but accelerations and reactive NST after IV fluids  Consult Dr Berton Lan with presentation, exam findings and test results.  Given presentation, late gestational age, and need for establishing care, plan to admit to L&D for augmentation of labor if not in active labor at this time.    Assessment: 1. History of cesarean delivery   2. Threatened labor at term   3. [redacted] weeks gestation of pregnancy   4. No prenatal care in current pregnancy in third trimester     Plan: Admit to L&D IOL for limited care, desires TOLAC, contractions with previous C/S, FHR tracing minimally reactive after IV fluids   Sharen Counter Certified Nurse-Midwife 01/27/2023 2:18 PM

## 2023-01-27 NOTE — Anesthesia Preprocedure Evaluation (Signed)
Anesthesia Evaluation  Patient identified by MRN, date of birth, ID band Patient awake    Reviewed: Allergy & Precautions, NPO status , Patient's Chart, lab work & pertinent test results  History of Anesthesia Complications Negative for: history of anesthetic complications  Airway Mallampati: III  TM Distance: >3 FB Neck ROM: Full    Dental  (+) Dental Advisory Given   Pulmonary neg pulmonary ROS   Pulmonary exam normal breath sounds clear to auscultation       Cardiovascular negative cardio ROS  Rhythm:Regular Rate:Normal     Neuro/Psych negative neurological ROS     GI/Hepatic negative GI ROS, Neg liver ROS,,,  Endo/Other  negative endocrine ROS    Renal/GU negative Renal ROS     Musculoskeletal   Abdominal   Peds  Hematology negative hematology ROS (+)   Anesthesia Other Findings Previous c-section x1  Reproductive/Obstetrics (+) Pregnancy                             Anesthesia Physical Anesthesia Plan  ASA: 2  Anesthesia Plan: Epidural   Post-op Pain Management:    Induction:   PONV Risk Score and Plan:   Airway Management Planned:   Additional Equipment:   Intra-op Plan:   Post-operative Plan:   Informed Consent:      Dental advisory given  Plan Discussed with: CRNA and Anesthesiologist  Anesthesia Plan Comments: (I have discussed risks of neuraxial anesthesia including but not limited to infection, bleeding, nerve injury, back pain, headache, seizures, and failure of block. Patient denies bleeding disorders and is not currently anticoagulated. Labs have been reviewed. Risks and benefits discussed. All patient's questions answered.  )       Anesthesia Quick Evaluation

## 2023-01-27 NOTE — MAU Note (Signed)
...  Amanda Pineda is a 32 y.o. at [redacted]w[redacted]d here in MAU reporting: CTX since 0600 this morning that are now every five minutes. Limited prenatal care. Denies VB or LOF. +FM.  -Previous C/S. She reports it was due to breech presentation. -She reports EDD is 6/9. She reports she stopped going to her prenatal visits this past December.  LMP:  Onset of complaint: 0600  Pain score:  8/10 lower abdomen 8/10 lower back   FHT: 163 initial external Lab orders placed from triage: MAU Labor Eval

## 2023-01-27 NOTE — H&P (Addendum)
OBSTETRIC ADMISSION HISTORY AND PHYSICAL  Amanda Pineda is a 33 y.o. female [phone] with IUP at [redacted]w[redacted]d presenting for labor. She reports +FMs, No LOF, no VB, no blurry vision, headaches or peripheral edema, and RUQ pain.  She plans on both breast/bottle feeding. She is undecided for birth control. She received limited prenatal care at  [facility]    Dating: By LMP --->  Estimated Date of Delivery: 01/30/23  Sono:  Not done  Prenatal History/Complications:  -no prenatal care -previous c/s for fetal intolerance, LTCS -TOLAC  Past Medical History: Past Medical History:  Diagnosis Date   Medical history non-contributory     Past Surgical History: Past Surgical History:  Procedure Laterality Date   CESAREAN SECTION N/A 02/17/2021   Procedure: CESAREAN SECTION;  Surgeon: Shea Evans, MD;  Location: [facility] LD [facility];  Service: Obstetrics;  Laterality: N/A;   OTHER SURGICAL HISTORY     cyst over eye?   TONSILLECTOMY      Obstetrical History: OB History     Gravida  4   Para  1   Term  1   Preterm      AB  2   Living  1      SAB  2   IAB      Ectopic      Multiple  0   Live Births  1           Social History Social History   Socioeconomic History   Marital status: Single    Spouse name: Not on file   Number of children: Not on file   Years of education: Not on file   Highest education level: Not on file  Occupational History   Not on file  Tobacco Use   Smoking status: Never   Smokeless tobacco: Never  Vaping Use   Vaping Use: Never used  Substance and Sexual Activity   Alcohol use: Not Currently    Comment: Socially   Drug use: Never   Sexual activity: Yes  Other Topics Concern   Not on file  Social History Narrative   Not on file   Social Determinants of Health   Financial Resource Strain: Not on file  Food Insecurity: Not on file  Transportation Needs: Not on file  Physical Activity: Not on file  Stress: Not on file  Social  Connections: Not on file    Family History: History reviewed. No pertinent family history.  Allergies: No Known Allergies  Medications Prior to Admission  Medication Sig Dispense Refill Last Dose   prenatal vitamin w/FE, FA (PRENATAL 1 + 1) 27-1 MG TABS tablet Take 1 tablet by mouth daily at 12 noon.   01/26/2023   coconut oil OIL Apply 1 application topically as needed.  0 Unknown   ibuprofen (ADVIL) 600 MG tablet Take 1 tablet (600 mg total) by mouth every 6 (six) hours. 30 tablet 0 Unknown   iron polysaccharides (FERREX 150) 150 MG capsule Take 1 capsule (150 mg total) by mouth daily.   Unknown   magnesium oxide (MAG-OX) 400 (240 Mg) MG tablet Take 1 tablet (400 mg total) by mouth daily. For prevention of constipation. 30 tablet  Unknown   senna-docusate (SENOKOT-S) 8.6-50 MG tablet Take 2 tablets by mouth daily.   Unknown   simethicone (MYLICON) 80 MG chewable tablet Chew 1 tablet (80 mg total) by mouth as needed for flatulence. 30 tablet 0 Unknown     Review of Systems   All systems  reviewed and negative except as stated in HPI  Blood pressure 110/79, pulse 94, temperature 98.2 F (36.8 C), temperature source Oral, resp. rate 17, height 5' (1.524 m), weight 71.5 kg, last menstrual period 04/25/2022, SpO2 100 %, unknown if currently breastfeeding. General appearance: alert, cooperative, and appears stated age Lungs: clear to auscultation bilaterally Heart: regular rate and rhythm Abdomen: soft, non-tender; bowel sounds normal Pelvic: deferred Extremities: no sign of DVT DTR's nml, +2 Presentation: cephalic Fetal monitoringBaseline: 140 bpm, Variability: Good {> 6 bpm), Accelerations: Reactive, and Decelerations: Absent Uterine activityFrequency: Every 4-5 minutes, Duration: 90-110 seconds, and Intensity: strong Dilation: 1 Effacement (%): 70 Station: Ballotable Exam by:: K. Cowher RN   Prenatal labs: ABO, Rh:   Antibody:   Rubella:   RPR:    HBsAg:    HIV:    GBS:  NEGATIVE/-- (06/06 1115)  1 hr Glucola not done Genetic screening  not done Anatomy US not done  Prenatal Transfer Tool  Maternal Diabetes: No Genetic Screening: Declined Maternal Ultrasounds/Referrals: Declined Fetal Ultrasounds or other Referrals:  None Maternal Substance Abuse:  No Significant Maternal Medications:  None Significant Maternal Lab Results: None  Results for orders placed or performed during the hospital encounter of 01/27/23 (from the past 24 hour(s))  Group B strep by PCR   Collection Time: 01/27/23 11:15 AM   Specimen: PATH Cytology Cervicovaginal Ancillary Only; Genital  Result Value Ref Range   Group B strep by PCR NEGATIVE NEGATIVE    Patient Active Problem List   Diagnosis Date Noted   No prenatal care in current pregnancy, third trimester 01/27/2023   Previous cesarean section complicating pregnancy 01/27/2023   Uterine contractions 01/27/2023   [redacted] weeks gestation of pregnancy 01/27/2023   History of COVID-19 02/17/2021    Assessment/Plan:  Amanda Pineda is a 31 y.o. Z6X0960 at [redacted]w[redacted]d here for labor; no prenatal care since 1st trimester. Dr Alvester Morin counseled regarding TOLAC vs RCS; risks/benefits discussed in detail. All questions answered.  Patient elects for TOLAC, consent signed 01/27/2023.   #Labor:Term TOLAC; having strong contractions; expectant management; consider AROM and pitocin augmentation if necessary. #Pain: Desires epidural #FWB: Cat 1 #ID:  GBS neg #MOF: breast in hospital, then pumping and bottle feeding #MOC:unsure, does not want any more children; offered PPIUD or Nexplanon #Circ:  yes  [name], Student-MidWife  01/27/2023, 3:13 PM   Midwife attestation: I have seen and examined this patient; I agree with above documentation in the student's note.   ROS, labs, PMH reviewed  PE: Gen: calm comfortable, NAD Resp: normal effort and rate Abd: gravid  Assessment/Plan: [redacted] weeks gestation Labor: active,  progressing well FWB: Cat I GBS: neg TOLAC- consent signed, counseled by Dr. Alvester Morin Admit to LD Expectant mngt Analgesia/anesthesia prn Anticipate VBAC  Donette Larry, CNM  01/27/2023, 5:37 PM

## 2023-01-27 NOTE — Progress Notes (Signed)
Dr. Alvester Morin obtained consent for proceeding with attempting to TOLAC/VBAC.

## 2023-01-28 ENCOUNTER — Encounter (HOSPITAL_COMMUNITY): Payer: Self-pay | Admitting: Family Medicine

## 2023-01-28 DIAGNOSIS — O34219 Maternal care for unspecified type scar from previous cesarean delivery: Secondary | ICD-10-CM | POA: Diagnosis not present

## 2023-01-28 DIAGNOSIS — O34211 Maternal care for low transverse scar from previous cesarean delivery: Secondary | ICD-10-CM

## 2023-01-28 DIAGNOSIS — O0933 Supervision of pregnancy with insufficient antenatal care, third trimester: Secondary | ICD-10-CM

## 2023-01-28 DIAGNOSIS — Z3A39 39 weeks gestation of pregnancy: Secondary | ICD-10-CM

## 2023-01-28 LAB — GC/CHLAMYDIA PROBE AMP (~~LOC~~) NOT AT ARMC
Chlamydia: NEGATIVE
Comment: NEGATIVE
Comment: NORMAL
Neisseria Gonorrhea: NEGATIVE

## 2023-01-28 LAB — RUBELLA SCREEN: Rubella: 1.36 index (ref >0.99)

## 2023-01-28 LAB — RPR: RPR Ser Ql: NONREACTIVE

## 2023-01-28 MED ORDER — ZOLPIDEM TARTRATE 5 MG PO TABS: 5.0000 mg | ORAL_TABLET | Freq: Every evening | ORAL | Status: DC | PRN

## 2023-01-28 MED ORDER — ACETAMINOPHEN 325 MG PO TABS: 650.0000 mg | ORAL_TABLET | ORAL | Status: DC | PRN

## 2023-01-28 MED ORDER — ONDANSETRON HCL 4 MG/2ML IJ SOLN: 4.0000 mg | INTRAMUSCULAR | Status: DC | PRN

## 2023-01-28 MED ORDER — TETANUS-DIPHTH-ACELL PERTUSSIS 5-2.5-18.5 LF-MCG/0.5 IM SUSY: 0.5000 mL | PREFILLED_SYRINGE | Freq: Once | INTRAMUSCULAR | Status: DC

## 2023-01-28 MED ORDER — IBUPROFEN 600 MG PO TABS
600.0000 mg | ORAL_TABLET | Freq: Four times a day (QID) | ORAL | Status: DC
  Administered 2023-01-28 – 2023-01-30 (×10): 600 mg via ORAL
  Filled 2023-01-28 (×10): qty 1

## 2023-01-28 MED ORDER — DIBUCAINE (PERIANAL) 1 % EX OINT: 1.0000 | TOPICAL_OINTMENT | CUTANEOUS | Status: DC | PRN

## 2023-01-28 MED ORDER — PRENATAL MULTIVITAMIN CH
1.0000 | ORAL_TABLET | Freq: Every day | ORAL | Status: DC
  Administered 2023-01-28 – 2023-01-30 (×3): 1 via ORAL
  Filled 2023-01-28 (×3): qty 1

## 2023-01-28 MED ORDER — SENNOSIDES-DOCUSATE SODIUM 8.6-50 MG PO TABS
2.0000 | ORAL_TABLET | ORAL | Status: DC
  Administered 2023-01-28 – 2023-01-30 (×2): 2 via ORAL
  Filled 2023-01-28 (×2): qty 2

## 2023-01-28 MED ORDER — SIMETHICONE 80 MG PO CHEW: 80.0000 mg | CHEWABLE_TABLET | ORAL | Status: DC | PRN

## 2023-01-28 MED ORDER — WITCH HAZEL-GLYCERIN EX PADS: 1.0000 | MEDICATED_PAD | CUTANEOUS | Status: DC | PRN

## 2023-01-28 MED ORDER — BENZOCAINE-MENTHOL 20-0.5 % EX AERO
1.0000 | INHALATION_SPRAY | CUTANEOUS | Status: DC | PRN
  Administered 2023-01-28: 1 via TOPICAL
  Filled 2023-01-28: qty 56

## 2023-01-28 MED ORDER — COCONUT OIL OIL: 1.0000 | TOPICAL_OIL | Status: DC | PRN

## 2023-01-28 MED ORDER — ONDANSETRON HCL 4 MG PO TABS: 4.0000 mg | ORAL_TABLET | ORAL | Status: DC | PRN

## 2023-01-28 MED ORDER — DIPHENHYDRAMINE HCL 25 MG PO CAPS: 25.0000 mg | ORAL_CAPSULE | Freq: Four times a day (QID) | ORAL | Status: DC | PRN

## 2023-01-28 NOTE — Discharge Summary (Signed)
Postpartum Discharge Summary  Date of Service updated***     Patient Name: Amanda Pineda DOB: Jun 03, 1991 MRN: [id number]  Date of admission: 01/27/2023 Delivery date:01/28/2023  Delivering provider: Aviva Signs  Date of discharge: 01/28/2023  Admitting diagnosis: Term pregnancy [Z34.90] Intrauterine pregnancy: [redacted]w[redacted]d     Secondary diagnosis:  Active Problems:   No prenatal care in current pregnancy, third trimester   Previous cesarean section complicating pregnancy   Uterine contractions   [redacted] weeks gestation of pregnancy   VBAC (vaginal birth after Cesarean)  Additional problems: tight nuchal cord, meconium stained amniotic fluid    Discharge diagnosis: Term Pregnancy Delivered                                              Post partum procedures:{Postpartum procedures:23558} Augmentation: AROM Complications: None  Hospital course: Onset of Labor With Vaginal Delivery     VBAC  32 y.o. yo [id number] at [redacted]w[redacted]d was admitted in Active Labor on 01/27/2023. Labor course was complicated by meconium fluid.  Membrane Rupture Time/Date: 9:58 PM ,01/27/2023   Delivery Method:Vaginal, Spontaneous  Episiotomy: None  Lacerations:  2nd degree  Patient had a postpartum course complicated by ***.  She is ambulating, tolerating a regular diet, passing flatus, and urinating well. Patient is discharged home in stable condition on 01/28/23.  Newborn Data: Birth date:01/28/2023  Birth time:1:49 AM  Gender:Female  Living status:Living  Apgars:9 ,9  Weight:2890 g   Magnesium Sulfate received: No BMZ received: No Rhophylac:No MMR:No T-DaP:{Tdap:23962} Flu: {HYQ:65784} Transfusion:{Transfusion received:30440034}  Physical exam  Vitals:   01/28/23 0200 01/28/23 0225 01/28/23 0230 01/28/23 0245  BP: 99/77 114/69 118/71 114/67  Pulse: (!) 106 (!) 107 (!) 101 91  Resp:      Temp:      TempSrc:      SpO2:      Weight:      Height:       General: {Exam; general:21111117} Lochia:  {Desc; appropriate/inappropriate:30686::"appropriate"} Uterine Fundus: {Desc; firm/soft:30687} Incision: {Exam; incision:21111123} DVT Evaluation: {Exam; dvt:2111122} Labs: Lab Results  Component Value Date   WBC 14.0 (H) 01/27/2023   HGB 10.6 (L) 01/27/2023   HCT 33.4 (L) 01/27/2023   MCV 79.3 (L) 01/27/2023   PLT 287 01/27/2023      Latest Ref Rng & Units 01/23/2022    8:48 PM  CMP  Glucose 70 - 99 mg/dL 94   BUN 6 - 20 mg/dL 11   Creatinine 6.96 - 1.00 mg/dL 2.95   Sodium 284 - 132 mmol/L 136   Potassium 3.5 - 5.1 mmol/L 3.5   Chloride 98 - 111 mmol/L 104   CO2 22 - 32 mmol/L 27   Calcium 8.9 - 10.3 mg/dL 9.0   Total Protein 6.5 - 8.1 g/dL 7.6   Total Bilirubin 0.3 - 1.2 mg/dL 0.4   Alkaline Phos 38 - 126 U/L 92   AST 15 - 41 U/L 19   ALT 0 - 44 U/L 9    Edinburgh Score:    02/19/2021   12:07 PM  Edinburgh Postnatal Depression Scale Screening Tool  I have been able to laugh and see the funny side of things. 0  I have looked forward with enjoyment to things. 0  I have blamed myself unnecessarily when things went wrong. 0  I have been anxious or worried for no good  reason. 0  I have felt scared or panicky for no good reason. 0  Things have been getting on top of me. 0  I have been so unhappy that I have had difficulty sleeping. 0  I have felt sad or miserable. 0  I have been so unhappy that I have been crying. 0  The thought of harming myself has occurred to me. 0  Edinburgh Postnatal Depression Scale Total 0      After visit meds:  Allergies as of 01/28/2023   No Known Allergies   Med Rec must be completed prior to using this [facility]***        Discharge home in stable condition Infant Feeding: {Baby feeding:23562} Infant Disposition:{CHL IP OB HOME WITH ZOXWRU:04540} Discharge instruction: per After Visit Summary and Postpartum booklet. Activity: Advance as tolerated. Pelvic rest for 6 weeks.  Diet: {OB diet:21111121} Anticipated Birth Control:  {Birth Control:23956} Postpartum Appointment:{Outpatient follow up:23559} Additional Postpartum F/U: {PP Procedure:23957} Future Appointments:No future appointments. Follow up Visit:      01/28/2023 Wynelle Bourgeois, CNM

## 2023-01-28 NOTE — Anesthesia Postprocedure Evaluation (Signed)
Anesthesia Post Note  Patient: Amanda Pineda  Procedure(s) Performed: AN AD HOC LABOR EPIDURAL     Patient location during evaluation: Mother Baby Anesthesia Type: Epidural Level of consciousness: awake and alert Pain management: pain level controlled Vital Signs Assessment: post-procedure vital signs reviewed and stable Respiratory status: spontaneous breathing, nonlabored ventilation and respiratory function stable Cardiovascular status: stable Postop Assessment: no headache, no backache, epidural receding, no apparent nausea or vomiting, patient able to bend at knees, able to ambulate and adequate PO intake Anesthetic complications: no   No notable events documented.  Last Vitals:  Vitals:   01/28/23 0416 01/28/23 0524  BP: 120/66 (!) 105/59  Pulse: 95 (!) 101  Resp: 18 18  Temp: 37.4 C 37.3 C  SpO2: 99% 99%    Last Pain:  Vitals:   01/28/23 0742  TempSrc:   PainSc: 0-No pain   Pain Goal:                   [employer]

## 2023-01-29 NOTE — Clinical Social Work Maternal (Signed)
CLINICAL SOCIAL WORK MATERNAL/CHILD NOTE  Patient Details  Name: Amanda Pineda MRN: [id number] Date of Birth: 06/17/1991  Date:  01/29/2023  Clinical Social Worker Initiating Note:  Kalii Chesmore, LCSWA Date/Time: Initiated:  01/29/23/1419     Child's Name:  Amanda Pineda   Biological Parents:  Mother, Father (FOB: Amanda Pineda, DOB: 03/17/1985)   Need for Interpreter:  None   Reason for Referral:  Late or No Prenatal Care     Address:  [address] [place] [address]    Phone number:  [phone] (home)     Additional phone number:   Household Members/Support Persons (HM/SP):   Household Member/Support Person 1, Household Member/Support Person 2   HM/SP Name Relationship DOB or Age  HM/SP -1 Amanda Pineda FOB 03/17/1985  HM/SP -2 Amanda Pineda Son 02/17/2021  HM/SP -3        HM/SP -4        HM/SP -5        HM/SP -6        HM/SP -7        HM/SP -8          Natural Supports (not living in the home):  Immediate Family, Extended Family   Professional Supports: None   Employment: Unemployed   Type of Work:     Education:  High school graduate   Homebound arranged:    Financial Resources:  Self-Pay     Other Resources:  WIC, Food Stamps   (WIC referral placed during consult, In process of recertifying Food Stamps)   Cultural/Religious Considerations Which May Impact Care:  None identified  Strengths:  Ability to meet basic needs  , Home prepared for child  , Pediatrician chosen   Psychotropic Medications:         Pediatrician:    [place] area  Pediatrician List:   [place]  ([facility], [place])  [place] Other  [place]    [place]    [place]    [place]      Pediatrician Fax Number:    Risk Factors/Current Problems:  None   Cognitive State:  Able to Concentrate  , Alert  , Goal Oriented  , Linear Thinking     Mood/Affect:  Comfortable  , Interested  , Calm  ,  Relaxed     CSW Assessment: CSW was consulted due to limited prenatal care. CSW met with [name] at bedside to complete assessment. When CSW entered room, [name] was observed sitting in hospital bed, holding and bonding with infant "Amanda." FOB was present sitting nearby. CSW introduced self and requested to speak with [name] alone. [name] provided verbal consent for CSW to complete consult with FOB present. CSW explained reason for consult. [name] presented as calm, was agreeable to consult and remained engaged throughout encounter.   [name] confirmed demographic information on file as correct. CSW inquired about [name]'s mental health history. [name] denies a history of mental health symptoms/diagnoses and denies a history of postpartum depression/anxiety after the birth of her older child. [name] identified FOB, her mother and her mother-in-law as supports. [name] denied current SI/HI.  CSW provided education regarding the baby blues period vs. perinatal mood disorders, discussed treatment and gave resources for mental health follow up if concerns arise.  CSW recommends self-evaluation during the postpartum time period using the New Mom Checklist from Postpartum Progress and encouraged [name] to contact a medical professional if symptoms are noted at any time.      [name] reports she has all needed items for infant, including a car seat and bassinet. [name] reports that she does not receive WIC but is interested in services. CSW placed a WIC referral with [name]'s verbal consent. [name] reports that she is in the process of re certifying for Food Stamps benefits and declined assistance. [name] declined additional resource needs.   CSW informed [name] about hospital drug screen policy due to limited prenatal care. CSW shared that infant's UDS resulted as negative for all illicit substances and explained that CDS would be monitored and a CPS report would be made if warranted. [name] expressed understanding. CSW inquired about substance use/barriers to prenatal  care during pregnancy. [name] denied illicit substance use during pregnancy. [name] denied transportation barriers. CSW inquired about reason why [name] received limited prenatal care. [name] reports that she does not have a reason for limited prenatal care, adding that she felt fine during her pregnancy and did not have any complications. [name] denied a history of prior CPS involvement.   CSW provided review of Sudden Infant Death Syndrome (SIDS) precautions.    CSW identifies no further need for intervention and no barriers to discharge at this time.  CSW Plan/Description:  No Further Intervention Required/No Barriers to Discharge, Sudden Infant Death Syndrome (SIDS) Education, Other Information/Referral to Community Resources, Perinatal Mood and Anxiety Disorder (PMADs) Education, Hospital Drug Screen Policy Information, CSW Will Continue to Monitor Umbilical Cord Tissue Drug Screen Results and Make Report if Warranted    Libertie Hausler K Berenize Gatlin, LCSWA 01/29/2023, 2:26 PM 

## 2023-01-29 NOTE — Progress Notes (Signed)
POSTPARTUM PROGRESS NOTE  Post Partum Day 1  Subjective:  Amanda Pineda is a 32 y.o. [phone] s/p VBAC at [redacted]w[redacted]d.  She reports she is doing well. No acute events overnight. She denies any problems with ambulating, voiding or po intake. Denies nausea or vomiting.  Pain is well controlled.  Lochia is appropriate.  Objective: Blood pressure (!) 87/51, pulse 78, temperature 97.9 F (36.6 C), temperature source Oral, resp. rate 16, height 5' (1.524 m), weight 71.5 kg, last menstrual period 04/25/2022, SpO2 99 %, unknown if currently breastfeeding.  Physical Exam:  General: alert, cooperative and no distress Chest: no respiratory distress Heart:regular rate, distal pulses intact Abdomen: soft, nontender,  Uterine Fundus: firm, appropriately tender DVT Evaluation: No calf swelling or tenderness Extremities: No LE edema Skin: warm, dry  Recent Labs    01/27/23 1513  HGB 10.6*  HCT 33.4*    Assessment/Plan: Amanda Pineda is a 32 y.o. [id number] s/p VBAC at [redacted]w[redacted]d   PPD#1 - Doing well  Routine postpartum care No PNC- SW consult Hypotension- Asymptomatic. Encouraged increased PO fluids.  Contraception: Declined Feeding: Breast Dispo: Plan for discharge 6/8-6/9.   LOS: 2 days   Lavonda Jumbo, DO OB Fellow, Faculty [facility] 01/29/2023, 6:55 AM

## 2023-01-30 MED ORDER — ACETAMINOPHEN 325 MG PO TABS: 650.0000 mg | ORAL_TABLET | ORAL | 0 refills | Status: AC | PRN

## 2023-02-03 ENCOUNTER — Telehealth: Payer: Self-pay | Admitting: Family Medicine

## 2023-02-03 NOTE — Telephone Encounter (Signed)
-----   Message from Celedonio Savage, MD sent at 01/30/2023  8:11 AM EDT ----- Regarding: Postpartum Visit  Discharge home in stable condition Infant Feeding: Breast Infant Disposition:home with mother Discharge instruction: per After Visit Summary and Postpartum booklet. Activity: Advance as tolerated. Pelvic rest for 6 weeks.  Diet: routine diet Anticipated Birth Control:Unsure Postpartum Appointment:4 weeks Additional Postpartum F/U:  NA Future Appointments:No future appointments. Follow up Visit:?

## 2023-02-03 NOTE — Telephone Encounter (Signed)
Attempted to reach patient to schedule her postpartum visit. Left a detailed message about her appointment, and to call the office.

## 2023-02-17 ENCOUNTER — Telehealth (HOSPITAL_COMMUNITY): Payer: Self-pay | Admitting: *Deleted

## 2023-02-17 NOTE — Telephone Encounter (Signed)
Attempted hospital discharge follow-up call. Left message for patient to return RN call with any questions or concerns. Deforest Hoyles, RN, 02/17/23, [phone]

## 2023-02-28 ENCOUNTER — Ambulatory Visit: Payer: Self-pay | Admitting: Family Medicine
